# Patient Record
Sex: Male | Born: 1975 | Race: Black or African American | Hispanic: No | Marital: Single | Smoking: Current every day smoker
Health system: Southern US, Community
[De-identification: ages and names within clinical notes are randomized; demographics above are authoritative.]

## PROBLEM LIST (undated history)

## (undated) HISTORY — PX: OTHER SURGICAL HISTORY: SHX169

---

## 2005-11-23 ENCOUNTER — Emergency Department (HOSPITAL_COMMUNITY): Admission: EM | Admit: 2005-11-23 | Discharge: 2005-11-23 | Payer: Self-pay | Admitting: Emergency Medicine

## 2007-10-06 ENCOUNTER — Emergency Department (HOSPITAL_COMMUNITY): Admission: EM | Admit: 2007-10-06 | Discharge: 2007-10-06 | Payer: Self-pay | Admitting: Emergency Medicine

## 2010-10-02 ENCOUNTER — Emergency Department (HOSPITAL_COMMUNITY)
Admission: EM | Admit: 2010-10-02 | Discharge: 2010-10-02 | Payer: Self-pay | Source: Home / Self Care | Admitting: Emergency Medicine

## 2010-10-02 LAB — URINALYSIS, ROUTINE W REFLEX MICROSCOPIC
Bilirubin Urine: NEGATIVE
Nitrite: NEGATIVE
Specific Gravity, Urine: 1.023 (ref 1.005–1.030)
pH: 6.5 (ref 5.0–8.0)

## 2010-10-03 LAB — GC/CHLAMYDIA PROBE AMP, GENITAL: Chlamydia, DNA Probe: NEGATIVE

## 2010-10-03 LAB — URINE CULTURE

## 2011-05-28 LAB — URINALYSIS, ROUTINE W REFLEX MICROSCOPIC
Bilirubin Urine: NEGATIVE
Glucose, UA: NEGATIVE
Ketones, ur: NEGATIVE
Protein, ur: 100 — AB
pH: 5.5

## 2011-05-28 LAB — URINE MICROSCOPIC-ADD ON

## 2011-05-28 LAB — GC/CHLAMYDIA PROBE AMP, GENITAL: GC Probe Amp, Genital: NEGATIVE

## 2011-11-19 ENCOUNTER — Encounter (HOSPITAL_COMMUNITY): Payer: Self-pay | Admitting: Emergency Medicine

## 2011-11-19 ENCOUNTER — Emergency Department (HOSPITAL_COMMUNITY)
Admission: EM | Admit: 2011-11-19 | Discharge: 2011-11-19 | Disposition: A | Discharge status: Home / Self Care | Payer: Self-pay | Attending: Emergency Medicine | Admitting: Emergency Medicine

## 2011-11-19 DIAGNOSIS — R110 Nausea: Secondary | ICD-10-CM | POA: Insufficient documentation

## 2011-11-19 DIAGNOSIS — R109 Unspecified abdominal pain: Secondary | ICD-10-CM | POA: Insufficient documentation

## 2011-11-19 DIAGNOSIS — R197 Diarrhea, unspecified: Secondary | ICD-10-CM | POA: Insufficient documentation

## 2011-11-19 DIAGNOSIS — F17200 Nicotine dependence, unspecified, uncomplicated: Secondary | ICD-10-CM | POA: Insufficient documentation

## 2011-11-19 DIAGNOSIS — G43909 Migraine, unspecified, not intractable, without status migrainosus: Secondary | ICD-10-CM | POA: Insufficient documentation

## 2011-11-19 MED ORDER — DIPHENHYDRAMINE HCL 25 MG PO CAPS
50.0000 mg | ORAL_CAPSULE | Freq: Once | ORAL | Status: AC
  Administered 2011-11-19: 50 mg via ORAL
  Filled 2011-11-19: qty 2

## 2011-11-19 MED ORDER — KETOROLAC TROMETHAMINE 60 MG/2ML IM SOLN
60.0000 mg | Freq: Once | INTRAMUSCULAR | Status: AC
  Administered 2011-11-19: 60 mg via INTRAMUSCULAR
  Filled 2011-11-19: qty 2

## 2011-11-19 MED ORDER — METOCLOPRAMIDE HCL 5 MG/ML IJ SOLN
10.0000 mg | Freq: Once | INTRAMUSCULAR | Status: AC
  Administered 2011-11-19: 10 mg via INTRAMUSCULAR

## 2011-11-19 MED ORDER — KETOROLAC TROMETHAMINE 30 MG/ML IJ SOLN
30.0000 mg | Freq: Once | INTRAMUSCULAR | Status: DC
  Filled 2011-11-19: qty 1

## 2011-11-19 MED ORDER — METOCLOPRAMIDE HCL 5 MG/ML IJ SOLN
10.0000 mg | Freq: Once | INTRAMUSCULAR | Status: DC
  Filled 2011-11-19: qty 2

## 2011-11-19 MED ORDER — SODIUM CHLORIDE 0.9 % IV BOLUS (SEPSIS): 1000.0000 mL | Freq: Once | INTRAVENOUS | Status: DC

## 2011-11-19 MED ORDER — DIPHENHYDRAMINE HCL 50 MG/ML IJ SOLN
25.0000 mg | Freq: Once | INTRAMUSCULAR | Status: DC
  Filled 2011-11-19: qty 1

## 2011-11-19 NOTE — ED Provider Notes (Signed)
History     CSN: [id number]  Arrival date & time 11/19/11  1800   First MD Initiated Contact with Patient 11/19/11 1937      Chief Complaint  Patient presents with  . Abdominal Pain  . Headache    (Consider location/radiation/quality/duration/timing/severity/associated sxs/prior treatment) HPI Comments: Patient does emergency department with a chief complaint of migraine headache.  Patient states this headache is similar to his previous migraines however it is lasting longer than usual.  He reports the headache began on Tuesday and is associated with nausea and photophobia.  She denies fevers, night sweats, chills, change in vision, or disequilibrium.  In addition patient states that he developed abdominal cramping this afternoon with diarrhea.  Patient has a sick friend with a stomach gastritis that he spent time with yesterday. Pt has no other complaints at this time.   Patient is a 36 y.o. male presenting with headaches. The history is provided by the patient.  Headache  Associated symptoms include nausea. Pertinent negatives include no fever, no shortness of breath and no vomiting.    Past Medical History  Diagnosis Date  . Migraine     Past Surgical History  Procedure Date  . Surgery for blood clot removal from the right  buttock     No family history on file.  History  Substance Use Topics  . Smoking status: Current Some Day Smoker  . Smokeless tobacco: Not on file  . Alcohol Use: No      Review of Systems  Constitutional: Negative for fever, chills and appetite change.  HENT: Negative for ear pain, congestion, neck pain and neck stiffness.   Eyes: Negative for visual disturbance.  Respiratory: Negative for shortness of breath.   Cardiovascular: Negative for chest pain and leg swelling.  Gastrointestinal: Positive for nausea and diarrhea. Negative for vomiting, abdominal pain, constipation, blood in stool, abdominal distention, anal bleeding and rectal pain.    Genitourinary: Negative for dysuria, urgency and frequency.  Neurological: Positive for headaches. Negative for dizziness, syncope, weakness, light-headedness and numbness.  Psychiatric/Behavioral: Negative for confusion.  All other systems reviewed and are negative.    Allergies  Review of patient's allergies indicates no known allergies.  Home Medications   Current Outpatient Rx  Name Route Sig Dispense Refill  . DIPHENHYDRAMINE HCL (SLEEP) 25 MG PO TABS Oral Take 25-50 mg by mouth daily as needed. FOR ALLERGIES      BP 125/79  Pulse 62  Temp(Src) 98.8 F (37.1 C) (Oral)  Resp 16  Wt 170 lb 13.7 oz (77.5 kg)  SpO2 100%  Physical Exam  Nursing note and vitals reviewed. Constitutional: He is oriented to person, place, and time. He appears well-developed and well-nourished. No distress.  HENT:  Head: Normocephalic and atraumatic.  Right Ear: External ear normal.  Left Ear: External ear normal.  Eyes: Conjunctivae and EOM are normal. Pupils are equal, round, and reactive to light. Right eye exhibits no discharge. Left eye exhibits no discharge. Right conjunctiva is not injected. Right conjunctiva has no hemorrhage. Left conjunctiva is not injected. Left conjunctiva has no hemorrhage. No scleral icterus. Right eye exhibits no nystagmus. Left eye exhibits no nystagmus.  Neck: Normal range of motion and full passive range of motion without pain. Neck supple. No spinous process tenderness present. No rigidity.  Cardiovascular: Normal rate, regular rhythm, normal heart sounds and intact distal pulses.   Pulmonary/Chest: Effort normal and breath sounds normal. No respiratory distress.  Abdominal:  abd non ttp, bowel sounds normal  Musculoskeletal: Normal range of motion.  Neurological: He is alert and oriented to person, place, and time. He has normal strength. No cranial nerve deficit or sensory deficit. Coordination and gait normal.  Skin: Skin is warm and dry. No rash noted.  He is not diaphoretic.    ED Course  Procedures (including critical care time)  Labs Reviewed - No data to display No results found.   No diagnosis found.  Pt refused IV fluids and meds. Pt does not want lab work and states this is his typical migraine with a viral infection on top. Pt will be treated with IM meds and dc w advise to follow up with his PCP. Pt able to tolerate PO fluids in ED > 6oz   MDM  Migraine  Diarrhea   Patient is hemodynamically stable and in NAD prior to discharge. Diagnosis was discussed with patient who verbalizes understanding and is agreeable to discharge. Pt w no focal neuro deficits or localized abdominal pain. He has been advised to return to ED if any of the following s/s develop: -In, worsening severity of headache, your tractable vomiting, fever that persists greater than 101, nuchal rigidity, or localized abdominal pain.     Jaci Carrel, [place] 11/19/11 2056

## 2011-11-19 NOTE — ED Notes (Signed)
Pt presented to the Er with c/o abd pain, diarrhea, pt describes pain more as a discomfort, also reports migraine, 9/10, pt states he was in contact with people that were "sick with cold' and that Tuesday morning he started to feel weaker and develop HA, abd pain and diarrhea.

## 2011-11-19 NOTE — Discharge Instructions (Signed)
Followup with your primary care Dr. in regards to today's hospital visit.  If you do not have a Dr. to followup with use resource guide listed below to help he find one.  Also read instructions for reasons to return to the emergency department until her more diagnosis. Return to Ed if following symptoms develop: worsening severity of headache, your tractable vomiting, fever that persists greater than 101, nuchal rigidity, or localized abdominal pain.   RESOURCE GUIDE  Dental Problems  Patients with Medicaid: [facility] [phone]                                           [phone] [address] Phone:  [phone]                                                  Phone:  [phone]  If unable to pay or uninsured, contact:  [facility] or [facility]. to become qualified for the adult dental clinic.  Chronic Pain Problems Contact [facility]  [phone] Patients need to be referred by their primary care doctor.  Insufficient Money for Medicine Contact [employer]:  call "211" or [facility] [phone].  No Primary Care Doctor Call [facility]  [phone] Other agencies that provide inexpensive medical care    [facility]  [phone]    [facility]  [phone]    [facility]  [phone]    [facility]  [phone]    [facility]  [phone]    [facility]  [phone]  Psychological Services [facility]  [phone] [facility] Services  [phone] [facility]   [phone] (emergency services [phone])  Substance Abuse Resources [facility]  2016251869 [facility] [phone] [facility] [place] [phone] Floyd[facility] Flock [phone] Residential & Outpatient Substance Abuse Program  [phone]  Abuse/Neglect [facility] Child Abuse Hotline 410-075-2598 [facility]  Child Abuse Hotline 2090872075 (After Hours)  Emergency Shelter [facility] [phone]  Maternity Homes [facility] [place] [facility] [place] [phone] [facility] [phone]  MRSA Hotline #:   (321)495-9169    [facility] Resources  [facility]     [employer]                          [facility] Dept. [address] [place]                       [address]      [address]  Patrecia Pace  [facility] Phone:  [phone]                                   Phone:  [phone]                 Phone:  [place] Phone:  [place] [phone] (After Hours)

## 2011-11-20 NOTE — ED Provider Notes (Signed)
Medical screening examination/treatment/procedure(s) were performed by non-physician practitioner and as supervising physician I was immediately available for consultation/collaboration.   Hurman Horn, MD 11/20/11 2322

## 2012-04-20 ENCOUNTER — Emergency Department (HOSPITAL_COMMUNITY): Payer: Self-pay

## 2012-04-20 ENCOUNTER — Emergency Department (HOSPITAL_COMMUNITY)
Admission: EM | Admit: 2012-04-20 | Discharge: 2012-04-20 | Disposition: A | Discharge status: Home / Self Care | Payer: Self-pay | Attending: Emergency Medicine | Admitting: Emergency Medicine

## 2012-04-20 ENCOUNTER — Encounter (HOSPITAL_COMMUNITY): Payer: Self-pay

## 2012-04-20 ENCOUNTER — Emergency Department (INDEPENDENT_AMBULATORY_CARE_PROVIDER_SITE_OTHER)
Admission: EM | Admit: 2012-04-20 | Discharge: 2012-04-20 | Disposition: A | Discharge status: Other Acute Inpatient Hospital | Payer: Self-pay | Source: Home / Self Care | Attending: Emergency Medicine | Admitting: Emergency Medicine

## 2012-04-20 DIAGNOSIS — K4090 Unilateral inguinal hernia, without obstruction or gangrene, not specified as recurrent: Secondary | ICD-10-CM

## 2012-04-20 DIAGNOSIS — R197 Diarrhea, unspecified: Secondary | ICD-10-CM | POA: Insufficient documentation

## 2012-04-20 DIAGNOSIS — R300 Dysuria: Secondary | ICD-10-CM | POA: Insufficient documentation

## 2012-04-20 DIAGNOSIS — K4091 Unilateral inguinal hernia, without obstruction or gangrene, recurrent: Secondary | ICD-10-CM | POA: Insufficient documentation

## 2012-04-20 DIAGNOSIS — R198 Other specified symptoms and signs involving the digestive system and abdomen: Secondary | ICD-10-CM

## 2012-04-20 DIAGNOSIS — K529 Noninfective gastroenteritis and colitis, unspecified: Secondary | ICD-10-CM

## 2012-04-20 DIAGNOSIS — K5289 Other specified noninfective gastroenteritis and colitis: Secondary | ICD-10-CM | POA: Insufficient documentation

## 2012-04-20 DIAGNOSIS — R109 Unspecified abdominal pain: Secondary | ICD-10-CM | POA: Insufficient documentation

## 2012-04-20 DIAGNOSIS — F17200 Nicotine dependence, unspecified, uncomplicated: Secondary | ICD-10-CM | POA: Insufficient documentation

## 2012-04-20 LAB — URINALYSIS, ROUTINE W REFLEX MICROSCOPIC
Bilirubin Urine: NEGATIVE
Glucose, UA: NEGATIVE mg/dL
Hgb urine dipstick: NEGATIVE
Ketones, ur: NEGATIVE mg/dL
Leukocytes, UA: NEGATIVE
Nitrite: NEGATIVE
Protein, ur: NEGATIVE mg/dL
Specific Gravity, Urine: 1.025 (ref 1.005–1.030)
Urobilinogen, UA: 1 mg/dL (ref 0.0–1.0)
pH: 6 (ref 5.0–8.0)

## 2012-04-20 LAB — COMPREHENSIVE METABOLIC PANEL WITH GFR
Alkaline Phosphatase: 83 U/L (ref 39–117)
BUN: 17 mg/dL (ref 6–23)
CO2: 30 meq/L (ref 19–32)
Chloride: 104 meq/L (ref 96–112)
Creatinine, Ser: 1.18 mg/dL (ref 0.50–1.35)
GFR calc Af Amer: >90 mL/min (ref >90)
GFR calc non Af Amer: 78 mL/min — ABNORMAL LOW (ref >90)
Glucose, Bld: 87 mg/dL (ref 70–99)
Total Bilirubin: 0.2 mg/dL — ABNORMAL LOW (ref 0.3–1.2)

## 2012-04-20 LAB — POCT URINALYSIS DIP (DEVICE)
Bilirubin Urine: NEGATIVE
Glucose, UA: NEGATIVE mg/dL
Leukocytes, UA: NEGATIVE
Nitrite: NEGATIVE
Urobilinogen, UA: 0.2 mg/dL (ref 0.0–1.0)

## 2012-04-20 LAB — COMPREHENSIVE METABOLIC PANEL
ALT: 21 U/L (ref 0–53)
AST: 29 U/L (ref 0–37)
Albumin: 4.2 g/dL (ref 3.5–5.2)
Calcium: 9.4 mg/dL (ref 8.4–10.5)
Potassium: 4.1 mEq/L (ref 3.5–5.1)
Sodium: 140 mEq/L (ref 135–145)
Total Protein: 7.9 g/dL (ref 6.0–8.3)

## 2012-04-20 LAB — CBC WITH DIFFERENTIAL/PLATELET
Basophils Absolute: 0 10*3/uL (ref 0.0–0.1)
Basophils Relative: 1 % (ref 0–1)
Eosinophils Absolute: 0.3 10*3/uL (ref 0.0–0.7)
Eosinophils Relative: 4 % (ref 0–5)
HCT: 43.5 % (ref 39.0–52.0)
Hemoglobin: 14.7 g/dL (ref 13.0–17.0)
Lymphocytes Relative: 53 % — ABNORMAL HIGH (ref 12–46)
Lymphs Abs: 3.2 K/uL (ref 0.7–4.0)
MCH: 29.3 pg (ref 26.0–34.0)
MCHC: 33.8 g/dL (ref 30.0–36.0)
MCV: 86.7 fL (ref 78.0–100.0)
Monocytes Absolute: 0.5 K/uL (ref 0.1–1.0)
Monocytes Relative: 9 % (ref 3–12)
Neutro Abs: 2 K/uL (ref 1.7–7.7)
Neutrophils Relative %: 34 % — ABNORMAL LOW (ref 43–77)
Platelets: 170 10*3/uL (ref 150–400)
RBC: 5.02 MIL/uL (ref 4.22–5.81)
RDW: 13.6 % (ref 11.5–15.5)
WBC: 6 10*3/uL (ref 4.0–10.5)

## 2012-04-20 LAB — LIPASE, BLOOD: Lipase: 54 U/L (ref 11–59)

## 2012-04-20 MED ORDER — HYDROCODONE-ACETAMINOPHEN 5-500 MG PO TABS: 1.0000 | ORAL_TABLET | Freq: Four times a day (QID) | ORAL | Status: AC | PRN

## 2012-04-20 MED ORDER — IOHEXOL 300 MG/ML  SOLN
20.0000 mL | INTRAMUSCULAR | Status: AC
  Administered 2012-04-20 (×2): 20 mL via ORAL

## 2012-04-20 MED ORDER — METRONIDAZOLE 500 MG PO TABS: 500.0000 mg | ORAL_TABLET | Freq: Three times a day (TID) | ORAL | Status: AC

## 2012-04-20 MED ORDER — ONDANSETRON HCL 4 MG/2ML IJ SOLN
4.0000 mg | Freq: Once | INTRAMUSCULAR | Status: AC
  Administered 2012-04-20: 4 mg via INTRAVENOUS
  Filled 2012-04-20: qty 2

## 2012-04-20 MED ORDER — IOHEXOL 300 MG/ML  SOLN
100.0000 mL | Freq: Once | INTRAMUSCULAR | Status: AC | PRN
  Administered 2012-04-20: 100 mL via INTRAVENOUS

## 2012-04-20 MED ORDER — MAGNESIUM CITRATE PO SOLN: 296.0000 mL | Freq: Once | ORAL | Status: AC

## 2012-04-20 MED ORDER — FENTANYL CITRATE 0.05 MG/ML IJ SOLN
50.0000 ug | Freq: Once | INTRAMUSCULAR | Status: AC
  Administered 2012-04-20: 50 ug via INTRAVENOUS
  Filled 2012-04-20: qty 2

## 2012-04-20 MED ORDER — CIPROFLOXACIN HCL 500 MG PO TABS: 500.0000 mg | ORAL_TABLET | Freq: Two times a day (BID) | ORAL | Status: AC

## 2012-04-20 NOTE — ED Notes (Signed)
Report given to Lana, RN.

## 2012-04-20 NOTE — ED Notes (Signed)
Multiple issues; lower abdominal area pain and swelling for past 2 weeks, worse w meals; he has parted ways w girlfriend after he found out she had been unfaithful and had noted pain w urination and a sensation he had been stung by something; denies rectal or stool issues; NAD

## 2012-04-20 NOTE — ED Notes (Cosign Needed)
Derek Chan is a 36 y.o. male who presents with complaint of abdominal pain and cramping for several days. Also an episode of diarrhea. Pt pending CT abdomen for further evaluation.   Results for orders placed during the hospital encounter of 04/20/12  URINALYSIS, ROUTINE W REFLEX MICROSCOPIC      Component Value Range   Color, Urine YELLOW  YELLOW   APPearance HAZY (*) CLEAR   Specific Gravity, Urine 1.025  1.005 - 1.030   pH 6.0  5.0 - 8.0   Glucose, UA NEGATIVE  NEGATIVE mg/dL   Hgb urine dipstick NEGATIVE  NEGATIVE   Bilirubin Urine NEGATIVE  NEGATIVE   Ketones, ur NEGATIVE  NEGATIVE mg/dL   Protein, ur NEGATIVE  NEGATIVE mg/dL   Urobilinogen, UA 1.0  0.0 - 1.0 mg/dL   Nitrite NEGATIVE  NEGATIVE   Leukocytes, UA NEGATIVE  NEGATIVE  CBC WITH DIFFERENTIAL      Component Value Range   WBC 6.0  4.0 - 10.5 K/uL   RBC 5.02  4.22 - 5.81 MIL/uL   Hemoglobin 14.7  13.0 - 17.0 g/dL   HCT 11.9  14.7 - 82.9 %   MCV 86.7  78.0 - 100.0 fL   MCH 29.3  26.0 - 34.0 pg   MCHC 33.8  30.0 - 36.0 g/dL   RDW 56.2  13.0 - 86.5 %   Platelets 170  150 - 400 K/uL   Neutrophils Relative 34 (*) 43 - 77 %   Neutro Abs 2.0  1.7 - 7.7 K/uL   Lymphocytes Relative 53 (*) 12 - 46 %   Lymphs Abs 3.2  0.7 - 4.0 K/uL   Monocytes Relative 9  3 - 12 %   Monocytes Absolute 0.5  0.1 - 1.0 K/uL   Eosinophils Relative 4  0 - 5 %   Eosinophils Absolute 0.3  0.0 - 0.7 K/uL   Basophils Relative 1  0 - 1 %   Basophils Absolute 0.0  0.0 - 0.1 K/uL  COMPREHENSIVE METABOLIC PANEL      Component Value Range   Sodium 140  135 - 145 mEq/L   Potassium 4.1  3.5 - 5.1 mEq/L   Chloride 104  96 - 112 mEq/L   CO2 30  19 - 32 mEq/L   Glucose, Bld 87  70 - 99 mg/dL   BUN 17  6 - 23 mg/dL   Creatinine, Ser 7.84  0.50 - 1.35 mg/dL   Calcium 9.4  8.4 - 69.6 mg/dL   Total Protein 7.9  6.0 - 8.3 g/dL   Albumin 4.2  3.5 - 5.2 g/dL   AST 29  0 - 37 U/L   ALT 21  0 - 53 U/L   Alkaline Phosphatase 83  39 - 117 U/L   Total  Bilirubin 0.2 (*) 0.3 - 1.2 mg/dL   GFR calc non Af Amer 78 (*) >90 mL/min   GFR calc Af Amer >90  >90 mL/min  LIPASE, BLOOD      Component Value Range   Lipase 54  11 - 59 U/L   Ct Abdomen Pelvis W Contrast  04/20/2012  *RADIOLOGY REPORT*  Clinical Data: Mid to lower abdominal pain.  Nausea and fever for 2 weeks.  CT ABDOMEN AND PELVIS WITH CONTRAST  Technique:  Multidetector CT imaging of the abdomen and pelvis was performed following the standard protocol during bolus administration of intravenous contrast.  Contrast: OMNIPAQUE IOHEXOL 300 MG/ML  SOLN  Comparison: None.  Findings: The  exam is technically degraded by paucity of body fat. No intra-abdominal free air or free fluid.  The lung bases appear within normal limits.  Liver grossly appears normal.  Spleen normal.  Normal renal enhancement.  The adrenal glands appear normal.  Pancreas and common bile duct are normal.  No gross adenopathy.  Small bowel appears within normal limits.  Large stool burden is present in the ascending colon.  Normal appendix identified (image 56 series 2).  Several loops of nonopacified small bowel are present in the central abdomen.  There may be some mural thickening of unopacified loops in the central abdomen raising the possibility of enteritis although poorly evaluated.  Rectum appears within normal limits.  Urinary bladder distended.  Bones appear within normal limits.  There is a right inguinal hernia containing a loop of small bowel without convincing evidence of obstruction.  Recommend clinical correlation for reducibility.  IMPRESSION: 1.  Several mildly thickened nondistended loops of small bowel in the central abdomen.  This is nonspecific, and is most commonly associated with enteritis.  Infiltrative process such as lymphoma considered less likely.  Inflammatory bowel disease is in the differential considerations. 2. Prominent right-sided stool burden. 3.  Right inguinal hernia containing several loops of  small bowel without convincing evidence of obstruction.  Correlate with physical exam to assess for reducibility.  Original Report Authenticated By: Andreas Newport, M.D.    11:13 PM CT showing possible enteritis. Will start on antibiotics. Laxative for constipation. Inguinal hernia right. Will d/c home with follow up. Pt non toxic, no vomiting, afebrile, NAD.   Filed Vitals:   04/20/12 1722  BP: 124/76  Pulse: 71  Temp: 98.7 F (37.1 C)  Resp: [address] A Baylie Drakes, PA 04/20/12 2314

## 2012-04-20 NOTE — ED Notes (Signed)
Pt reports lower abd pain x3 weeks, pt reports tingling/burning w/urination, pt also reports burning w/intercourse this am, pt reports his fiance his experiencing the same symptoms. Pt reports his fiance' informed him she had been "unfaithful," [facility] for STD and a urine test. [facility] for dx and treatment.

## 2012-04-20 NOTE — ED Provider Notes (Signed)
History   This chart was scribed for Derek Chan. Oletta Lamas, MD by Charolett Bumpers . The patient was seen in room TR05C/TR05C. Patient's care was started at 1840.    CSN: [id number]  Arrival date & time 04/20/12  1716   First MD Initiated Contact with Patient 04/20/12 1840      Chief Complaint  Patient presents with  . SEXUALLY TRANSMITTED DISEASE    (Consider location/radiation/quality/duration/timing/severity/associated sxs/prior treatment) HPI Derek Chan is a 36 y.o. male who presents to the Emergency Department complaining of constant, moderate discomfort in lower abdominal area for the past 3 weeks. Pt reports associated dysuria and burning w/intercourse this am, but states that it has been some discomfort with sex prior but not as severe at today. Pt reports his fiance his experiencing the same symptoms. Pt reports his fiance' informed him she had been "unfaithful," Pt seen at Urgent Care today, Urgent Care swabbed pt's penis for STD and a urine test. Urgent Care sent pt to ED for dx and treatment. Pt reports some associated diarrhea a few days ago. Pt reports associated fever and chills as well. Pt reports that is lower abdominal pain is aggravated immediately after eating. Pt denies any h/o abdominal surgery.    Past Medical History  Diagnosis Date  . Migraine     Past Surgical History  Procedure Date  . Surgery for blood clot removal from the right  buttock     History reviewed. No pertinent family history.  History  Substance Use Topics  . Smoking status: Current Some Day Smoker  . Smokeless tobacco: Not on file  . Alcohol Use: No      Review of Systems  Constitutional: Positive for fever and chills.  Respiratory: Negative for shortness of breath.   Gastrointestinal: Positive for abdominal pain and diarrhea. Negative for nausea and vomiting.  Genitourinary: Positive for dysuria.  Neurological: Negative for weakness.  All other systems reviewed and are  negative.    Allergies  Review of patient's allergies indicates no known allergies.  Home Medications   Current Outpatient Rx  Name Route Sig Dispense Refill  . ACETAMINOPHEN 325 MG PO TABS Oral Take 650 mg by mouth every 6 (six) hours as needed. For pain    . AMOXICILLIN 500 MG PO CAPS Oral Take 500 mg by mouth 3 (three) times daily. For 10 days; Start date 03/28/12    . DIPHENHYDRAMINE HCL (SLEEP) 25 MG PO TABS Oral Take 25-50 mg by mouth daily as needed. For allergies    . HYDROCODONE-ACETAMINOPHEN 5-500 MG PO TABS Oral Take 1 tablet by mouth every 4 (four) hours as needed. For pain    . CIPROFLOXACIN HCL 500 MG PO TABS Oral Take 1 tablet (500 mg total) by mouth 2 (two) times daily. 14 tablet 0  . HYDROCODONE-ACETAMINOPHEN 5-500 MG PO TABS Oral Take 1-2 tablets by mouth every 6 (six) hours as needed for pain. 15 tablet 0  . MAGNESIUM CITRATE PO SOLN Oral Take 296 mLs by mouth once. 300 mL 0  . METRONIDAZOLE 500 MG PO TABS Oral Take 1 tablet (500 mg total) by mouth 3 (three) times daily. 21 tablet 0    BP 124/76  Pulse 71  Temp 98.7 F (37.1 C) (Oral)  Resp 16  SpO2 99%  Physical Exam  Nursing note and vitals reviewed. Constitutional: He is oriented to person, place, and time. He appears well-developed and well-nourished. No distress.  HENT:  Head: Normocephalic and atraumatic.  Eyes: EOM are  normal.  Neck: Neck supple. No tracheal deviation present.  Cardiovascular: Normal rate.   Pulmonary/Chest: Effort normal. No respiratory distress.  Abdominal: Soft. Bowel sounds are normal. There is tenderness. Hernia confirmed negative in the right inguinal area and confirmed negative in the left inguinal area.       Mild tenderness mostly in suprapubic, mildly in RLQ and LLQ.   Genitourinary: Penis normal. Right testis shows no tenderness. Left testis shows no tenderness. No discharge found.       Inguinal lymphadenopathy bilaterally that pt states he has previously been evaluated  for, and states that have reduced in size. No testicular pain, no other masses or hernia. No penile discharge.   Musculoskeletal: Normal range of motion.  Neurological: He is alert and oriented to person, place, and time.  Skin: Skin is warm and dry.  Psychiatric: He has a normal mood and affect. His behavior is normal.    ED Course  Procedures (including critical care time)  DIAGNOSTIC STUDIES: Oxygen Saturation is 99% on room air, normal by my interpretation.    COORDINATION OF CARE:  18:49-Discussed planned course of treatment with the patient, who is agreeable at this time.   Results for orders placed during the hospital encounter of 04/20/12  URINALYSIS, ROUTINE W REFLEX MICROSCOPIC      Component Value Range   Color, Urine YELLOW  YELLOW   APPearance HAZY (*) CLEAR   Specific Gravity, Urine 1.025  1.005 - 1.030   pH 6.0  5.0 - 8.0   Glucose, UA NEGATIVE  NEGATIVE mg/dL   Hgb urine dipstick NEGATIVE  NEGATIVE   Bilirubin Urine NEGATIVE  NEGATIVE   Ketones, ur NEGATIVE  NEGATIVE mg/dL   Protein, ur NEGATIVE  NEGATIVE mg/dL   Urobilinogen, UA 1.0  0.0 - 1.0 mg/dL   Nitrite NEGATIVE  NEGATIVE   Leukocytes, UA NEGATIVE  NEGATIVE  CBC WITH DIFFERENTIAL      Component Value Range   WBC 6.0  4.0 - 10.5 K/uL   RBC 5.02  4.22 - 5.81 MIL/uL   Hemoglobin 14.7  13.0 - 17.0 g/dL   HCT 62.9  52.8 - 41.3 %   MCV 86.7  78.0 - 100.0 fL   MCH 29.3  26.0 - 34.0 pg   MCHC 33.8  30.0 - 36.0 g/dL   RDW 24.4  01.0 - 27.2 %   Platelets 170  150 - 400 K/uL   Neutrophils Relative 34 (*) 43 - 77 %   Neutro Abs 2.0  1.7 - 7.7 K/uL   Lymphocytes Relative 53 (*) 12 - 46 %   Lymphs Abs 3.2  0.7 - 4.0 K/uL   Monocytes Relative 9  3 - 12 %   Monocytes Absolute 0.5  0.1 - 1.0 K/uL   Eosinophils Relative 4  0 - 5 %   Eosinophils Absolute 0.3  0.0 - 0.7 K/uL   Basophils Relative 1  0 - 1 %   Basophils Absolute 0.0  0.0 - 0.1 K/uL  COMPREHENSIVE METABOLIC PANEL      Component Value Range    Sodium 140  135 - 145 mEq/L   Potassium 4.1  3.5 - 5.1 mEq/L   Chloride 104  96 - 112 mEq/L   CO2 30  19 - 32 mEq/L   Glucose, Bld 87  70 - 99 mg/dL   BUN 17  6 - 23 mg/dL   Creatinine, Ser 5.36  0.50 - 1.35 mg/dL   Calcium 9.4  8.4 -  10.5 mg/dL   Total Protein 7.9  6.0 - 8.3 g/dL   Albumin 4.2  3.5 - 5.2 g/dL   AST 29  0 - 37 U/L   ALT 21  0 - 53 U/L   Alkaline Phosphatase 83  39 - 117 U/L   Total Bilirubin 0.2 (*) 0.3 - 1.2 mg/dL   GFR calc non Af Amer 78 (*) >90 mL/min   GFR calc Af Amer >90  >90 mL/min  LIPASE, BLOOD      Component Value Range   Lipase 54  11 - 59 U/L    No results found.   1. Enteritis   2. Inguinal hernia       MDM  I personally performed the services described in this documentation, which was scribed in my presence. The recorded information has been reviewed and considered.  Pt with concerns for STD.  Cultures sent.  However some suprapubic pains as well as inguinal lymphadenopathy which he reports has been present for a long time.  Seen at urgent care, sent for further eval.  Not toxic in appearing.  Mildly tender in lower abd.  No fevers, no vomiting, occasion nausea.  Will order CT of abd/pelvis, place in CDU holding and discussed with oncoming attending and PAC.       Derek Chan. Oletta Lamas, MD 04/30/12 [phone]

## 2012-04-20 NOTE — ED Notes (Signed)
Pt reports he had a ua completed at [facility]

## 2012-04-20 NOTE — ED Notes (Addendum)
Pt c/o abdominal pain and Pain with urination and intercourse. Pt states everything appears normal looking in genitalia. Pt states that pain is worse with palpation and also increases after eating. Denies n/v/d.

## 2012-04-20 NOTE — ED Provider Notes (Addendum)
History     CSN: [id number]  Arrival date & time 04/20/12  1425   First MD Initiated Contact with Patient 04/20/12 1557      Chief Complaint  Patient presents with  . Abdominal Pain    (Consider location/radiation/quality/duration/timing/severity/associated sxs/prior treatment) HPI Comments: Patient presents urgent care complaining of in term attempt sporadic tingling and burning with urination. Denies any discharge. Patient complains of lower abdominal pain swelling and pain for 2 weeks. Pain has gotten progressively worse in the last week. Denies any nausea or vomiting denies any diarrhea does rectal pain. He also describes that his girlfriend have admitted that she has had unprotected sex with another individual. Patient denies any fevers.  Patient is a 36 y.o. male presenting with abdominal pain.  Abdominal Pain The primary symptoms of the illness include abdominal pain and dysuria. The primary symptoms of the illness do not include fever, fatigue, hematochezia, vaginal discharge or vaginal bleeding. The current episode started more than 2 days ago. The problem has not changed since onset. The dysuria is not associated with frequency.  Symptoms associated with the illness do not include chills, anorexia, constipation, frequency or back pain.    Past Medical History  Diagnosis Date  . Migraine     Past Surgical History  Procedure Date  . Surgery for blood clot removal from the right  buttock     History reviewed. No pertinent family history.  History  Substance Use Topics  . Smoking status: Current Some Day Smoker  . Smokeless tobacco: Not on file  . Alcohol Use: No      Review of Systems  Constitutional: Negative for fever, chills, activity change and fatigue.  Gastrointestinal: Positive for abdominal pain. Negative for constipation, hematochezia, abdominal distention and anorexia.  Genitourinary: Positive for dysuria. Negative for frequency, vaginal bleeding and  vaginal discharge.  Musculoskeletal: Negative for back pain.  Skin: Negative for rash.    Allergies  Review of patient's allergies indicates no known allergies.  Home Medications   Current Outpatient Rx  Name Route Sig Dispense Refill  . DIPHENHYDRAMINE HCL (SLEEP) 25 MG PO TABS Oral Take 25-50 mg by mouth daily as needed. FOR ALLERGIES      BP 138/62  Pulse 68  Temp 98.7 F (37.1 C) (Oral)  Resp 18  SpO2 100%  Physical Exam  Nursing note and vitals reviewed. Constitutional: He appears well-developed and well-nourished.  HENT:  Head: Normocephalic.  Eyes: Conjunctivae are normal.  Pulmonary/Chest: Effort normal.  Abdominal: Soft. He exhibits no distension and no mass. There is no hepatosplenomegaly. There is tenderness in the periumbilical area and suprapubic area. There is no rebound, no guarding and no CVA tenderness.    Genitourinary: Testes normal and penis normal.  Musculoskeletal: Normal range of motion. He exhibits no tenderness.  Neurological: He is alert.  Skin: No rash noted. No erythema.    ED Course  Procedures (including critical care time)  Labs Reviewed  POCT URINALYSIS DIP (DEVICE) - Abnormal; Notable for the following:    Hgb urine dipstick MODERATE (*)     All other components within normal limits  GC/CHLAMYDIA PROBE AMP, GENITAL   No results found.   Moderate hematuria and reproducible suprapubic and infraumbilical pain.( focal)   MDM  Patient presents urgent care this evening complaining of an ongoing abdominal pain for 2 weeks. Examples of what concerning for infraumbilical reproducible pain somewhat disproportionate. Suspicion for a ureteral or vesical lithiasis. We have also obtained samples were GM and  Chlamydia DNA probe. Patient has been advised that he will be called if any abnormal testing. Patient denies any penile discharge and had a normal external genitalia exam. Most concerning is his abdominal exam.        Jimmie Molly,  MD 04/20/12 1702  Jimmie Molly, MD 04/20/12 [phone]

## 2012-04-21 LAB — GC/CHLAMYDIA PROBE AMP, GENITAL
Chlamydia, DNA Probe: NEGATIVE
GC Probe Amp, Genital: NEGATIVE

## 2015-03-27 ENCOUNTER — Emergency Department (HOSPITAL_BASED_OUTPATIENT_CLINIC_OR_DEPARTMENT_OTHER)
Admission: EM | Admit: 2015-03-27 | Discharge: 2015-03-27 | Disposition: A | Discharge status: Home / Self Care | Payer: Self-pay | Attending: Emergency Medicine | Admitting: Emergency Medicine

## 2015-03-27 ENCOUNTER — Encounter (HOSPITAL_BASED_OUTPATIENT_CLINIC_OR_DEPARTMENT_OTHER): Payer: Self-pay | Admitting: *Deleted

## 2015-03-27 DIAGNOSIS — Z8679 Personal history of other diseases of the circulatory system: Secondary | ICD-10-CM | POA: Insufficient documentation

## 2015-03-27 DIAGNOSIS — R51 Headache: Secondary | ICD-10-CM | POA: Insufficient documentation

## 2015-03-27 DIAGNOSIS — Z720 Tobacco use: Secondary | ICD-10-CM | POA: Insufficient documentation

## 2015-03-27 DIAGNOSIS — R5383 Other fatigue: Secondary | ICD-10-CM | POA: Insufficient documentation

## 2015-03-27 DIAGNOSIS — M545 Low back pain: Secondary | ICD-10-CM | POA: Insufficient documentation

## 2015-03-27 DIAGNOSIS — R42 Dizziness and giddiness: Secondary | ICD-10-CM | POA: Insufficient documentation

## 2015-03-27 LAB — BASIC METABOLIC PANEL
Anion gap: 4 — ABNORMAL LOW (ref 5–15)
BUN: 12 mg/dL (ref 6–20)
CO2: 26 mmol/L (ref 22–32)
Calcium: 8 mg/dL — ABNORMAL LOW (ref 8.9–10.3)
Chloride: 107 mmol/L (ref 101–111)
Creatinine, Ser: 1.01 mg/dL (ref 0.61–1.24)
GFR calc non Af Amer: >60 mL/min (ref >60)
GLUCOSE: 84 mg/dL (ref 65–99)
Potassium: 3.7 mmol/L (ref 3.5–5.1)
SODIUM: 137 mmol/L (ref 135–145)

## 2015-03-27 NOTE — ED Provider Notes (Signed)
CSN: [id number]     Arrival date & time 03/27/15  1908 History  This chart was scribed for Marily MemosJason Tarren Sabree, MD by Lyndel SafeKaitlyn Shelton, ED Scribe. This patient was seen in room MH11/MH11 and the patient's care was started 7:28 PM.   Chief Complaint  Patient presents with  . Fatigue   The history is provided by the patient. No language interpreter was used.   HPI Comments: Derek Chan is a 39 y.o. male, with a PMhx of migraines, who presents to the Emergency Department complaining of gradual onset, waxing and waning generalized fatigue that began 2 days ago with an associated headache. He states he works in [employer] and Monday, while at work, he began to feel dizzy and pre-syncopal. Pt reports he was drinking fluids as appropriate for working conditions. He notes a mild, 3/10 headache currently for which he has taken a goody powder that alleviated his headache mildly. He reports dark golden urine on Monday which resolved that day; stating his urine has been clear recently. The pt has missed work since the onset of his symptoms. He denies the current headache to be normal of his past migraine presentation. He additionally notes lower back pain which he attributes to his sleeping position last night. He denies LOC, abdominal pain, CP, SOB, nausea, vomiting, vision changes, BLE swelling, myalgias, arthralgias, hearing decrease or loss, or rashes.    Past Medical History  Diagnosis Date  . Migraine    Past Surgical History  Procedure Laterality Date  . Surgery for blood clot removal from the right  buttock     History reviewed. No pertinent family history. History  Substance Use Topics  . Smoking status: Current Some Day Smoker  . Smokeless tobacco: Not on file  . Alcohol Use: No    Review of Systems  Constitutional: Positive for fatigue. Negative for fever.  HENT: Negative for hearing loss.   Eyes: Negative for photophobia and visual disturbance.  Respiratory: Negative for shortness of  breath.   Cardiovascular: Negative for chest pain and leg swelling.  Gastrointestinal: Negative for nausea, vomiting, abdominal pain and diarrhea.  Musculoskeletal: Positive for back pain. Negative for myalgias and arthralgias.  Skin: Negative for rash.  Neurological: Positive for dizziness and headaches. Negative for syncope.    Allergies  Review of patient's allergies indicates no known allergies.  Home Medications   Prior to Admission medications   Medication Sig Start Date End Date Taking? Authorizing Provider  acetaminophen (TYLENOL) 325 MG tablet Take 650 mg by mouth every 6 (six) hours as needed. For pain   Yes Historical Provider, MD   BP 117/75 mmHg  Pulse 66  Temp(Src) 98.3 F (36.8 C) (Oral)  Resp 18  Ht 5\' 11"  (1.803 m)  Wt 172 lb (78.019 kg)  BMI 24.00 kg/m2  SpO2 99% Physical Exam  Constitutional: He is oriented to person, place, and time. He appears well-developed and well-nourished. No distress.  HENT:  Head: Normocephalic and atraumatic.  Mouth/Throat: Oropharynx is clear and moist.  Eyes: Conjunctivae are normal. Right eye exhibits no discharge. Left eye exhibits no discharge. No scleral icterus.  Neck: No JVD present.  Cardiovascular: Normal rate, regular rhythm and normal heart sounds.   Pulmonary/Chest: Effort normal and breath sounds normal. No respiratory distress.  Abdominal: Soft. Bowel sounds are normal. He exhibits no distension. There is no tenderness.  Neurological: He is alert and oriented to person, place, and time. He has normal reflexes. He displays normal reflexes. No cranial nerve deficit. Coordination  normal.  Strength and sensation intact bilaterally; normal, steady gait.   Skin: Skin is warm. No rash noted. No erythema. No pallor.  Psychiatric: He has a normal mood and affect. His behavior is normal.  Nursing note and vitals reviewed.  ED Course  Procedures  DIAGNOSTIC STUDIES: Oxygen Saturation is 99% on RA, normal by my  interpretation.    COORDINATION OF CARE: 7:36 PM Discussed treatment plan which includes to order diagnostic labs with pt. Pt is not driving himself home. Pt acknowledges and agrees to plan.   Labs Review Labs Reviewed  BASIC METABOLIC PANEL - Abnormal; Notable for the following:    Calcium 8.0 (*)    Anion gap 4 (*)    All other components within normal limits    Imaging Review No results found.   EKG Interpretation None      MDM   Final diagnoses:  Other fatigue   39 year old with likely heat related illness. BMP done to evaluate for hyponatremia as a large amount of water intake on Monday. Seems to have improved now and just wanted reevaluation to start back to work tomorrow. Family history otherwise as above. No rhabdomyolysis. Still making urine without significant kidney injury.  I have personally and contemperaneously reviewed labs and imaging and used in my decision making as above.   I feel the patient has had an appropriate workup for their chief complaint at this time and likelihood of emergent condition existing is low. They have been counseled on decision, discharge, follow up and which symptoms necessitate immediate return to the emergency department. They or their family verbally stated understanding and agreement with plan and discharged in stable condition.    I personally performed the services described in this documentation, which was scribed in my presence. The recorded information has been reviewed and is accurate.     Marily Memos, MD 03/27/15 2124

## 2015-03-27 NOTE — ED Notes (Signed)
MD at bedside. Discussing pt d/c

## 2015-03-27 NOTE — ED Notes (Signed)
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

## 2019-01-17 ENCOUNTER — Encounter (HOSPITAL_COMMUNITY): Payer: Self-pay

## 2019-01-17 ENCOUNTER — Emergency Department (HOSPITAL_COMMUNITY)
Admission: EM | Admit: 2019-01-17 | Discharge: 2019-01-17 | Disposition: A | Discharge status: Home / Self Care | Payer: Self-pay | Attending: Emergency Medicine | Admitting: Emergency Medicine

## 2019-01-17 ENCOUNTER — Emergency Department (HOSPITAL_COMMUNITY): Payer: Self-pay

## 2019-01-17 ENCOUNTER — Other Ambulatory Visit: Payer: Self-pay

## 2019-01-17 DIAGNOSIS — F17210 Nicotine dependence, cigarettes, uncomplicated: Secondary | ICD-10-CM | POA: Insufficient documentation

## 2019-01-17 DIAGNOSIS — Z79899 Other long term (current) drug therapy: Secondary | ICD-10-CM | POA: Insufficient documentation

## 2019-01-17 DIAGNOSIS — M5090 Cervical disc disorder, unspecified, unspecified cervical region: Secondary | ICD-10-CM | POA: Insufficient documentation

## 2019-01-17 LAB — CBC WITH DIFFERENTIAL/PLATELET
Abs Immature Granulocytes: 0.01 10*3/uL (ref 0.00–0.07)
Basophils Absolute: 0 10*3/uL (ref 0.0–0.1)
Basophils Relative: 1 %
Eosinophils Absolute: 0.2 10*3/uL (ref 0.0–0.5)
Eosinophils Relative: 4 %
HCT: 45.6 % (ref 39.0–52.0)
Hemoglobin: 14.7 g/dL (ref 13.0–17.0)
Immature Granulocytes: 0 %
Lymphocytes Relative: 49 %
Lymphs Abs: 2.6 10*3/uL (ref 0.7–4.0)
MCH: 29.2 pg (ref 26.0–34.0)
MCHC: 32.2 g/dL (ref 30.0–36.0)
MCV: 90.5 fL (ref 80.0–100.0)
Monocytes Absolute: 0.5 10*3/uL (ref 0.1–1.0)
Monocytes Relative: 9 %
Neutro Abs: 2 10*3/uL (ref 1.7–7.7)
Neutrophils Relative %: 37 %
Platelets: 203 10*3/uL (ref 150–400)
RBC: 5.04 MIL/uL (ref 4.22–5.81)
RDW: 14.7 % (ref 11.5–15.5)
WBC: 5.4 10*3/uL (ref 4.0–10.5)
nRBC: 0 % (ref 0.0–0.2)

## 2019-01-17 LAB — BASIC METABOLIC PANEL
Anion gap: 6 (ref 5–15)
BUN: 13 mg/dL (ref 6–20)
CO2: 26 mmol/L (ref 22–32)
Calcium: 9.2 mg/dL (ref 8.9–10.3)
Chloride: 106 mmol/L (ref 98–111)
Creatinine, Ser: 0.86 mg/dL (ref 0.61–1.24)
GFR calc Af Amer: >60 mL/min (ref >60)
GFR calc non Af Amer: >60 mL/min (ref >60)
Glucose, Bld: 111 mg/dL — ABNORMAL HIGH (ref 70–99)
Potassium: 4.2 mmol/L (ref 3.5–5.1)
Sodium: 138 mmol/L (ref 135–145)

## 2019-01-17 NOTE — ED Triage Notes (Signed)
Patient c/o numbness of the right thumb in March. patient states that he has been getting numbness of the right arm and shoulders . Patient states,"It seems like every week I have new numbness." No slurred speech. No other reported numbness.

## 2019-01-17 NOTE — ED Notes (Signed)
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

## 2019-01-17 NOTE — ED Provider Notes (Signed)
[facility]-EMERGENCY DEPT Provider Note   CSN: [id number] Arrival date & time: 01/17/19  1100    History   Chief Complaint Chief Complaint  Patient presents with   Numbness    HPI Derek Chan is a 43 y.o. male.     HPI Patient presents with almost 2 months of numbness especially to the right upper extremity.  States the numbness started in his right thumb and then progressed up his arm.  He also is developed numbness in his left arm as of late.  He denies head or neck injury.  Complains of some right-sided neck pain which he thinks is from the way that he slept.  He denies visual changes or speech changes.  No gait difficulty. Past Medical History:  Diagnosis Date   Migraine     There are no active problems to display for this patient.   Past Surgical History:  Procedure Laterality Date   surgery for blood clot removal from the right  buttock          Home Medications    Prior to Admission medications   Medication Sig Start Date End Date Taking? Authorizing Provider  acetaminophen (TYLENOL) 325 MG tablet Take 650 mg by mouth every 6 (six) hours as needed. For pain    [provider]    Family History Family History  Family history unknown: Yes    Social History Social History   Tobacco Use   Smoking status: Current Every Day Smoker    Packs/day: 0.50    Types: Cigarettes   Smokeless tobacco: Never Used  Substance Use Topics   Alcohol use: No   Drug use: Not Currently    Types: Marijuana     Allergies   Patient has no known allergies.   Review of Systems Review of Systems  Constitutional: Negative for chills and fever.  HENT: Negative for sore throat and trouble swallowing.   Eyes: Negative for visual disturbance.  Respiratory: Negative for cough and shortness of breath.   Cardiovascular: Negative for chest pain.  Gastrointestinal: Negative for abdominal pain, constipation, diarrhea, nausea and vomiting.    Musculoskeletal: Positive for myalgias and neck pain. Negative for arthralgias.  Skin: Negative for rash and wound.  Neurological: Positive for numbness. Negative for dizziness, speech difficulty, weakness, light-headedness and headaches.  All other systems reviewed and are negative.    Physical Exam Updated Vital Signs BP 116/82 (BP Location: Right Arm)    Pulse 66    Temp 98.8 F (37.1 C) (Oral)    Resp 14    Ht 5' 11.75" (1.822 m)    Wt 77.6 kg    SpO2 99%    BMI 23.35 kg/m   Physical Exam Vitals signs and nursing note reviewed.  Constitutional:      General: He is not in acute distress.    Appearance: Normal appearance. He is well-developed. He is not ill-appearing.  HENT:     Head: Normocephalic and atraumatic.     Comments: Cranial nerves II through XII intact.    Nose: Nose normal.     Mouth/Throat:     Mouth: Mucous membranes are moist.  Eyes:     Pupils: Pupils are equal, round, and reactive to light.  Neck:     Musculoskeletal: Normal range of motion and neck supple. Muscular tenderness present. No neck rigidity.     Comments: No posterior midline cervical tenderness to palpation.  Patient does have some right paracervical muscular tenderness.  No  meningismus. Cardiovascular:     Rate and Rhythm: Normal rate and regular rhythm.     Heart sounds: No murmur. No friction rub. No gallop.   Pulmonary:     Effort: Pulmonary effort is normal. No respiratory distress.     Breath sounds: Normal breath sounds. No stridor. No wheezing, rhonchi or rales.  Chest:     Chest wall: No tenderness.  Abdominal:     General: Bowel sounds are normal.     Palpations: Abdomen is soft.     Tenderness: There is no abdominal tenderness. There is no guarding or rebound.  Musculoskeletal: Normal range of motion.        General: No swelling, tenderness, deformity or signs of injury.     Right lower leg: No edema.     Left lower leg: No edema.     Comments: 2+ pulses in all extremities.   Positive Tinel sign in the right upper extremity.  No extremity swelling, erythema or warmth.  Lymphadenopathy:     Cervical: No cervical adenopathy.  Skin:    General: Skin is warm and dry.     Findings: No erythema or rash.  Neurological:     Mental Status: He is alert and oriented to person, place, and time.     Comments: Decreased sensation to light touch to the right hand and right forearm.  Question mild decreased grip strength in the right compared to the left.  5/5 motor bilateral lower extremities.  Ambulating without difficulty.  Psychiatric:        Mood and Affect: Mood normal.        Behavior: Behavior normal.      ED Treatments / Results  Labs (all labs ordered are listed, but only abnormal results are displayed) Labs Reviewed  BASIC METABOLIC PANEL - Abnormal; Notable for the following components:      Result Value   Glucose, Bld 111 (*)    All other components within normal limits  CBC WITH DIFFERENTIAL/PLATELET    EKG None  Radiology Mr Brain Wo Contrast  Result Date: 01/17/2019 CLINICAL DATA:  Numbness of the RIGHT thumb in March. Also numbness of the RIGHT arm and shoulders. This is described as progressive. EXAM: MRI HEAD WITHOUT CONTRAST MRI CERVICAL SPINE WITHOUT CONTRAST TECHNIQUE: Multiplanar, multiecho pulse sequences of the brain and surrounding structures, and cervical spine, to include the craniocervical junction and cervicothoracic junction, were obtained without intravenous contrast. COMPARISON:  None. FINDINGS: MRI HEAD FINDINGS Brain: No evidence for acute infarction, hemorrhage, mass lesion, hydrocephalus, or extra-axial fluid. Normal cerebral volume. No white matter disease. Vascular: Flow voids are maintained throughout the carotid, basilar, and vertebral arteries. There are no areas of chronic hemorrhage. Skull and upper cervical spine: Normal marrow signal. Sinuses/Orbits: No significant sinus opacity.  Negative orbits. Other: None. MRI CERVICAL  SPINE FINDINGS Alignment: Physiologic. Vertebrae: Low signal intensity bone marrow on T1 weighted images could be related to smoking, anemia, or body habitus. No evidence of diskitis, or worrisome osseous lesion. Cord: Cord flattening at C3-4, with slight abnormal cord signal, due to disc pathology. Posterior Fossa, vertebral arteries, paraspinal tissues: Unremarkable. Disc levels: C2-3:  Unremarkable. C3-4: Central and rightward disc extrusion. Significant cord flattening. Slight abnormal cord signal best seen on sagittal imaging, see image 7 of T2 and STIR sagittal series. RIGHT greater than LEFT C4 foraminal narrowing. Canal diameter estimated at 5-6 mm. C4-5:  Unremarkable. C5-6: Annular bulge with osseous spurring. Effacement anterior subarachnoid space without significant cord compression. LEFT  C6 foraminal narrowing. C6-7: Disc space narrowing. Central and leftward protrusion. Effacement anterior subarachnoid space with mild cord flattening. LEFT greater than RIGHT C7 foraminal narrowing. C7-T1:  Unremarkable. IMPRESSION: MRI brain is negative. MRI cervical spine demonstrates a dominant abnormality at C3-4, where a central and rightward disc extrusion results in significant cord flattening and slight abnormal cord signal. RIGHT greater than LEFT C4 foraminal narrowing. Minor cervical spondylosis elsewhere. Electronically Signed   By: Elsie StainJohn T Curnes M.D.   On: 01/17/2019 13:22   Mr Cervical Spine Wo Contrast  Result Date: 01/17/2019 CLINICAL DATA:  Numbness of the RIGHT thumb in March. Also numbness of the RIGHT arm and shoulders. This is described as progressive. EXAM: MRI HEAD WITHOUT CONTRAST MRI CERVICAL SPINE WITHOUT CONTRAST TECHNIQUE: Multiplanar, multiecho pulse sequences of the brain and surrounding structures, and cervical spine, to include the craniocervical junction and cervicothoracic junction, were obtained without intravenous contrast. COMPARISON:  None. FINDINGS: MRI HEAD FINDINGS Brain: No  evidence for acute infarction, hemorrhage, mass lesion, hydrocephalus, or extra-axial fluid. Normal cerebral volume. No white matter disease. Vascular: Flow voids are maintained throughout the carotid, basilar, and vertebral arteries. There are no areas of chronic hemorrhage. Skull and upper cervical spine: Normal marrow signal. Sinuses/Orbits: No significant sinus opacity.  Negative orbits. Other: None. MRI CERVICAL SPINE FINDINGS Alignment: Physiologic. Vertebrae: Low signal intensity bone marrow on T1 weighted images could be related to smoking, anemia, or body habitus. No evidence of diskitis, or worrisome osseous lesion. Cord: Cord flattening at C3-4, with slight abnormal cord signal, due to disc pathology. Posterior Fossa, vertebral arteries, paraspinal tissues: Unremarkable. Disc levels: C2-3:  Unremarkable. C3-4: Central and rightward disc extrusion. Significant cord flattening. Slight abnormal cord signal best seen on sagittal imaging, see image 7 of T2 and STIR sagittal series. RIGHT greater than LEFT C4 foraminal narrowing. Canal diameter estimated at 5-6 mm. C4-5:  Unremarkable. C5-6: Annular bulge with osseous spurring. Effacement anterior subarachnoid space without significant cord compression. LEFT C6 foraminal narrowing. C6-7: Disc space narrowing. Central and leftward protrusion. Effacement anterior subarachnoid space with mild cord flattening. LEFT greater than RIGHT C7 foraminal narrowing. C7-T1:  Unremarkable. IMPRESSION: MRI brain is negative. MRI cervical spine demonstrates a dominant abnormality at C3-4, where a central and rightward disc extrusion results in significant cord flattening and slight abnormal cord signal. RIGHT greater than LEFT C4 foraminal narrowing. Minor cervical spondylosis elsewhere. Electronically Signed   By: Elsie StainJohn T Curnes M.D.   On: 01/17/2019 13:22    Procedures Procedures (including critical care time)  Medications Ordered in ED Medications - No data to  display   Initial Impression / Assessment and Plan / ED Course  I have reviewed the triage vital signs and the nursing notes.  Pertinent labs & imaging results that were available during my care of the patient were reviewed by me and considered in my medical decision making (see chart for details).        Patient with severe disc disease which appears to be responsible for his symptoms.  Advised close follow-up with neurosurgery.  Return precautions given.   Final Clinical Impressions(s) / ED Diagnoses   Final diagnoses:  Cervical disc disease    ED Discharge Orders    None       Loren RacerYelverton, Elize Pinon, MD 01/17/19 [phone]

## 2019-02-23 ENCOUNTER — Emergency Department (HOSPITAL_COMMUNITY): Payer: Self-pay

## 2019-02-23 ENCOUNTER — Emergency Department (HOSPITAL_COMMUNITY)
Admission: EM | Admit: 2019-02-23 | Discharge: 2019-02-23 | Disposition: A | Discharge status: Home / Self Care | Payer: Self-pay | Attending: Emergency Medicine | Admitting: Emergency Medicine

## 2019-02-23 ENCOUNTER — Other Ambulatory Visit: Payer: Self-pay

## 2019-02-23 ENCOUNTER — Encounter (HOSPITAL_COMMUNITY): Payer: Self-pay | Admitting: Emergency Medicine

## 2019-02-23 DIAGNOSIS — Z79899 Other long term (current) drug therapy: Secondary | ICD-10-CM | POA: Insufficient documentation

## 2019-02-23 DIAGNOSIS — F17210 Nicotine dependence, cigarettes, uncomplicated: Secondary | ICD-10-CM | POA: Insufficient documentation

## 2019-02-23 DIAGNOSIS — N451 Epididymitis: Secondary | ICD-10-CM | POA: Insufficient documentation

## 2019-02-23 DIAGNOSIS — R319 Hematuria, unspecified: Secondary | ICD-10-CM | POA: Insufficient documentation

## 2019-02-23 LAB — COMPREHENSIVE METABOLIC PANEL
ALT: 18 U/L (ref 0–44)
AST: 18 U/L (ref 15–41)
Albumin: 3.3 g/dL — ABNORMAL LOW (ref 3.5–5.0)
Alkaline Phosphatase: 69 U/L (ref 38–126)
Anion gap: 10 (ref 5–15)
BUN: 11 mg/dL (ref 6–20)
CO2: 27 mmol/L (ref 22–32)
Calcium: 9.2 mg/dL (ref 8.9–10.3)
Chloride: 104 mmol/L (ref 98–111)
Creatinine, Ser: 0.98 mg/dL (ref 0.61–1.24)
GFR calc Af Amer: >60 mL/min (ref >60)
GFR calc non Af Amer: >60 mL/min (ref >60)
Glucose, Bld: 114 mg/dL — ABNORMAL HIGH (ref 70–99)
Potassium: 4.4 mmol/L (ref 3.5–5.1)
Sodium: 141 mmol/L (ref 135–145)
Total Bilirubin: 0.5 mg/dL (ref 0.3–1.2)
Total Protein: 7.7 g/dL (ref 6.5–8.1)

## 2019-02-23 LAB — URINALYSIS, ROUTINE W REFLEX MICROSCOPIC
Bilirubin Urine: NEGATIVE
Glucose, UA: NEGATIVE mg/dL
Ketones, ur: NEGATIVE mg/dL
Nitrite: NEGATIVE
Protein, ur: >=300 mg/dL — AB
RBC / HPF: >50 RBC/hpf — ABNORMAL HIGH (ref 0–5)
Specific Gravity, Urine: 1.018 (ref 1.005–1.030)
WBC, UA: >50 WBC/hpf — ABNORMAL HIGH (ref 0–5)
pH: 6 (ref 5.0–8.0)

## 2019-02-23 LAB — CBC WITH DIFFERENTIAL/PLATELET
Abs Immature Granulocytes: 0.05 10*3/uL (ref 0.00–0.07)
Basophils Absolute: 0.1 10*3/uL (ref 0.0–0.1)
Basophils Relative: 1 %
Eosinophils Absolute: 0.3 10*3/uL (ref 0.0–0.5)
Eosinophils Relative: 2 %
HCT: 42.2 % (ref 39.0–52.0)
Hemoglobin: 13.9 g/dL (ref 13.0–17.0)
Immature Granulocytes: 1 %
Lymphocytes Relative: 24 %
Lymphs Abs: 2.6 10*3/uL (ref 0.7–4.0)
MCH: 29 pg (ref 26.0–34.0)
MCHC: 32.9 g/dL (ref 30.0–36.0)
MCV: 87.9 fL (ref 80.0–100.0)
Monocytes Absolute: 1 10*3/uL (ref 0.1–1.0)
Monocytes Relative: 9 %
Neutro Abs: 6.9 10*3/uL (ref 1.7–7.7)
Neutrophils Relative %: 63 %
Platelets: 294 10*3/uL (ref 150–400)
RBC: 4.8 MIL/uL (ref 4.22–5.81)
RDW: 14.6 % (ref 11.5–15.5)
WBC: 10.9 10*3/uL — ABNORMAL HIGH (ref 4.0–10.5)
nRBC: 0 % (ref 0.0–0.2)

## 2019-02-23 MED ORDER — DOXYCYCLINE HYCLATE 100 MG PO CAPS: 100.0000 mg | ORAL_CAPSULE | Freq: Two times a day (BID) | ORAL | 0 refills | Status: DC

## 2019-02-23 MED ORDER — HYDROCODONE-ACETAMINOPHEN 5-325 MG PO TABS: 1.0000 | ORAL_TABLET | Freq: Four times a day (QID) | ORAL | 0 refills | Status: DC | PRN

## 2019-02-23 MED ORDER — IBUPROFEN 600 MG PO TABS: 600.0000 mg | ORAL_TABLET | Freq: Four times a day (QID) | ORAL | 0 refills | Status: DC | PRN

## 2019-02-23 MED ORDER — KETOROLAC TROMETHAMINE 15 MG/ML IJ SOLN: 15.0000 mg | Freq: Once | INTRAMUSCULAR | Status: DC

## 2019-02-23 MED ORDER — KETOROLAC TROMETHAMINE 30 MG/ML IJ SOLN
30.0000 mg | Freq: Once | INTRAMUSCULAR | Status: AC
  Administered 2019-02-23: 30 mg via INTRAVENOUS
  Filled 2019-02-23: qty 1

## 2019-02-23 MED ORDER — DOXYCYCLINE HYCLATE 100 MG PO TABS
100.0000 mg | ORAL_TABLET | Freq: Once | ORAL | Status: AC
  Administered 2019-02-23: 100 mg via ORAL
  Filled 2019-02-23: qty 1

## 2019-02-23 MED ORDER — CEFTRIAXONE SODIUM 250 MG IJ SOLR
250.0000 mg | Freq: Once | INTRAMUSCULAR | Status: AC
  Administered 2019-02-23: 250 mg via INTRAMUSCULAR
  Filled 2019-02-23: qty 250

## 2019-02-23 NOTE — ED Triage Notes (Signed)
Patient arrived by self from home. Pt c/o groin pain and blood in urine. Patient stated complaints started Monday.  Patient stated he had a fever at home.   Patient stated on Monday patient had a little discomfort. Patient stated Tuesday night it became for painful that he couldn't move his legs.   Patient stated this had never happened before.   Patient took at home Tylenol and Penicillin w/ some relief.

## 2019-02-23 NOTE — ED Provider Notes (Signed)
[facility] DEPT Provider Note   CSN: [id number] Arrival date & time: 02/23/19  0746    History   Chief Complaint Chief Complaint  Patient presents with  . Groin Pain  . Hematuria    HPI Derek Chan is a 43 y.o. male with history of migraines who presents with a 4-day history of groin pain and swelling as well as hematuria.  Patient reports having severe tenderness and pain to his left testicle and scrotum.  He has had associated swelling.  He has had some left lower quadrant pain with this.  He describes the pain is constant.  He has felt like he has had a fever, the highest measured was 99.3.  He has no history of kidney stones.  He denies any flank pain.  He denies any other urinary symptoms sides hematuria, but he has seen some but he describes his blood clots.  He has been taking Tylenol for pain and fever at home.  He denies any STD exposure, states he is only sexually active with his wife.     HPI  Past Medical History:  Diagnosis Date  . Migraine     There are no active problems to display for this patient.   Past Surgical History:  Procedure Laterality Date  . surgery for blood clot removal from the right  buttock          Home Medications    Prior to Admission medications   Medication Sig Start Date End Date Taking? Authorizing Provider  acetaminophen (TYLENOL) 325 MG tablet Take 650 mg by mouth every 6 (six) hours as needed. For pain    [provider]  doxycycline (VIBRAMYCIN) 100 MG capsule Take 1 capsule (100 mg total) by mouth 2 (two) times daily. 02/23/19   Janelli Welling, Bea Graff, PA-C  HYDROcodone-acetaminophen (NORCO/VICODIN) 5-325 MG tablet Take 1 tablet by mouth every 6 (six) hours as needed for severe pain. 02/23/19   Jalynne Persico, Bea Graff, PA-C  ibuprofen (ADVIL) 600 MG tablet Take 1 tablet (600 mg total) by mouth every 6 (six) hours as needed. 02/23/19   Frederica Kuster, PA-C    Family History Family History  Family  history unknown: Yes    Social History Social History   Tobacco Use  . Smoking status: Current Every Day Smoker    Packs/day: 0.50    Types: Cigarettes  . Smokeless tobacco: Never Used  Substance Use Topics  . Alcohol use: No  . Drug use: Not Currently    Types: Marijuana     Allergies   Patient has no known allergies.   Review of Systems Review of Systems  Constitutional: Negative for chills and fever.  HENT: Negative for facial swelling and sore throat.   Respiratory: Negative for shortness of breath.   Cardiovascular: Negative for chest pain.  Gastrointestinal: Positive for abdominal pain (LLQ). Negative for nausea and vomiting.  Genitourinary: Positive for hematuria, scrotal swelling and testicular pain. Negative for discharge, dysuria, flank pain, penile pain and penile swelling.  Musculoskeletal: Negative for back pain.  Skin: Negative for rash and wound.  Neurological: Negative for headaches.  Psychiatric/Behavioral: The patient is not nervous/anxious.      Physical Exam Updated Vital Signs BP 118/71 (BP Location: Right Arm)   Pulse 78   Temp 99.8 F (37.7 C) (Oral)   Resp 15   Ht 5' 11.75" (1.822 m)   Wt 77.1 kg   SpO2 99%   BMI 23.22 kg/m   Physical Exam  Vitals signs and nursing note reviewed. Exam conducted with a chaperone present.  Constitutional:      General: He is not in acute distress.    Appearance: He is well-developed. He is not diaphoretic.  HENT:     Head: Normocephalic and atraumatic.     Mouth/Throat:     Pharynx: No oropharyngeal exudate.  Eyes:     General: No scleral icterus.       Right eye: No discharge.        Left eye: No discharge.     Conjunctiva/sclera: Conjunctivae normal.     Pupils: Pupils are equal, round, and reactive to light.  Neck:     Musculoskeletal: Normal range of motion and neck supple.     Thyroid: No thyromegaly.  Cardiovascular:     Rate and Rhythm: Normal rate and regular rhythm.     Heart sounds:  Normal heart sounds. No murmur. No friction rub. No gallop.   Pulmonary:     Effort: Pulmonary effort is normal. No respiratory distress.     Breath sounds: Normal breath sounds. No stridor. No wheezing or rales.  Abdominal:     General: Bowel sounds are normal. There is no distension.     Palpations: Abdomen is soft.     Tenderness: There is abdominal tenderness in the left lower quadrant. There is no right CVA tenderness, left CVA tenderness, guarding or rebound.    Genitourinary:    Penis: Normal and circumcised.      Scrotum/Testes:        Right: Tenderness or swelling not present.        Left: Tenderness and swelling present.     Epididymis:     Right: No tenderness.     Left: Tenderness present.     Prostate: Normal. Not enlarged and not tender.  Lymphadenopathy:     Cervical: No cervical adenopathy.  Skin:    General: Skin is warm and dry.     Coloration: Skin is not pale.     Findings: No rash.  Neurological:     Mental Status: He is alert.     Coordination: Coordination normal.      ED Treatments / Results  Labs (all labs ordered are listed, but only abnormal results are displayed) Labs Reviewed  CBC WITH DIFFERENTIAL/PLATELET - Abnormal; Notable for the following components:      Result Value   WBC 10.9 (*)    All other components within normal limits  COMPREHENSIVE METABOLIC PANEL - Abnormal; Notable for the following components:   Glucose, Bld 114 (*)    Albumin 3.3 (*)    All other components within normal limits  URINALYSIS, ROUTINE W REFLEX MICROSCOPIC - Abnormal; Notable for the following components:   APPearance HAZY (*)    Hgb urine dipstick MODERATE (*)    Protein, ur >=300 (*)    Leukocytes,Ua TRACE (*)    RBC / HPF >50 (*)    WBC, UA >50 (*)    Bacteria, UA RARE (*)    All other components within normal limits  URINE CULTURE  GC/CHLAMYDIA PROBE AMP ([place]) NOT AT [facility]    EKG None  Radiology [place] Scrotum W/doppler  Result Date:  02/23/2019 CLINICAL DATA:  Left-sided scrotal pain. Scrotal swelling. Hematuria. EXAM: SCROTAL ULTRASOUND DOPPLER ULTRASOUND OF THE TESTICLES TECHNIQUE: Complete ultrasound examination of the testicles, epididymis, and other scrotal structures was performed. Color and spectral Doppler ultrasound were also utilized to evaluate blood flow to the testicles.  COMPARISON:  None. FINDINGS: Right testicle Measurements: 4.7 x 2.4 x 3.2 cm. No mass or microlithiasis visualized. Left testicle Measurements: 4.2 x 2.5 x 2.9 cm. No mass or microlithiasis visualized. Right epididymis: Normal in size and echogenicity. 6 mm simple appearing cyst. Normal vascularity. Left epididymis: Increased in size when compared to the right as well as heterogeneous and demonstrating increased vascularity. 3 mm cyst. Hydrocele: Bilateral hydroceles small on the right and moderate to large on the left. Left with a septation. Varicocele:  Bilateral varicoceles. Pulsed Doppler interrogation of both testes demonstrates normal low resistance arterial and venous waveforms bilaterally. IMPRESSION: 1. Acute left epididymitis. 2. No other acute abnormalities. No testicular masses or evidence of torsion. 3. Moderate to large left and small right hydroceles. Small bilateral epididymal cysts. 4. Bilateral varicoceles. Electronically Signed   By: Amie Portlandavid  Ormond M.D.   On: 02/23/2019 10:46    Procedures Procedures (including critical care time)  Medications Ordered in ED Medications  cefTRIAXone (ROCEPHIN) injection 250 mg (250 mg Intramuscular Given 02/23/19 1122)  doxycycline (VIBRA-TABS) tablet 100 mg (100 mg Oral Given 02/23/19 1119)  ketorolac (TORADOL) 30 MG/ML injection 30 mg (30 mg Intravenous Given 02/23/19 1120)     Initial Impression / Assessment and Plan / ED Course  I have reviewed the triage vital signs and the nursing notes.  Pertinent labs & imaging results that were available during my care of the patient were reviewed by me and  considered in my medical decision making (see chart for details).        Patient with left scrotal swelling and pain found to be acute left epididymitis.  Patient also found to have bilateral hydroceles, epididymal cysts, and varicoceles no concern for prostatitis, there is no tenderness or bogginess.  Patient has no concern for STD exposure.  UA shows greater than 50 RBCs and WBCs.  Urine culture, as well as GC/chlamydia sent.  Patient treated with Rocephin and doxycycline.  Scrotal support and ice recommended.  Ibuprofen as well as Norco sent home for pain.  Patient referred to urology for follow-up and further management.  He also requests information for the neurosurgeon he was recommended to see last visit for cervical disc extrusion.  He is advised to call both of these offices to make an appointment.  Return precautions discussed.  Patient understands and agrees with plan.  Patient vitals stable throughout ED course and discharged in satisfactory condition.  Final Clinical Impressions(s) / ED Diagnoses   Final diagnoses:  Epididymitis    ED Discharge Orders         Ordered    ibuprofen (ADVIL) 600 MG tablet  Every 6 hours PRN     02/23/19 1121    HYDROcodone-acetaminophen (NORCO/VICODIN) 5-325 MG tablet  Every 6 hours PRN     02/23/19 1121    doxycycline (VIBRAMYCIN) 100 MG capsule  2 times daily     02/23/19 1121           Rollins Wrightson, PoundAlexandra M, PA-C 02/23/19 1654    Samuel JesterMcManus, Kathleen, DO 02/27/19 1443

## 2019-02-23 NOTE — Discharge Instructions (Signed)
Take doxycycline until completed.  Take ibuprofen as prescribed every 6 hours.  For severe, breakthrough pain, take Norco every 6 hours.  I recommend using ice and scrotal support like a jockstrap.  Please follow-up with urologist below for further evaluation and recheck of your epididymitis.  Please return to the emergency department if you develop any new or worsening symptoms.  Do not drink alcohol, drive, operate machinery or participate in any other potentially dangerous activities while taking opiate pain medication as it may make you sleepy. Do not take this medication with any other sedating medications, either prescription or over-the-counter. If you were prescribed Percocet or Vicodin, do not take these with acetaminophen (Tylenol) as it is already contained within these medications and overdose of Tylenol is dangerous.   This medication is an opiate (or narcotic) pain medication and can be habit forming.  Use it as little as possible to achieve adequate pain control.  Do not use or use it with extreme caution if you have a history of opiate abuse or dependence. This medication is intended for your use only - do not give any to anyone else and keep it in a secure place where nobody else, especially children, have access to it. It will also cause or worsen constipation, so you may want to consider taking an over-the-counter stool softener while you are taking this medication.

## 2019-02-24 LAB — URINE CULTURE: Culture: <10000 — AB

## 2020-01-05 IMAGING — US ULTRASOUND SCROTUM DOPPLER COMPLETE
1 series · 13 of 25 positions shown · non-contrast
Comparison: None.

CLINICAL DATA: Left-sided scrotal pain. Scrotal swelling.
Hematuria.

EXAM:
SCROTAL ULTRASOUND
DOPPLER ULTRASOUND OF THE TESTICLES
TECHNIQUE: Complete ultrasound examination of the testicles, epididymis, and
other scrotal structures was performed. Color and spectral Doppler
ultrasound were also utilized to evaluate blood flow to the
testicles.

[Series 1: ultrasound scrotum doppler complete · 13 of 162 slices shown]
[im 1/162]
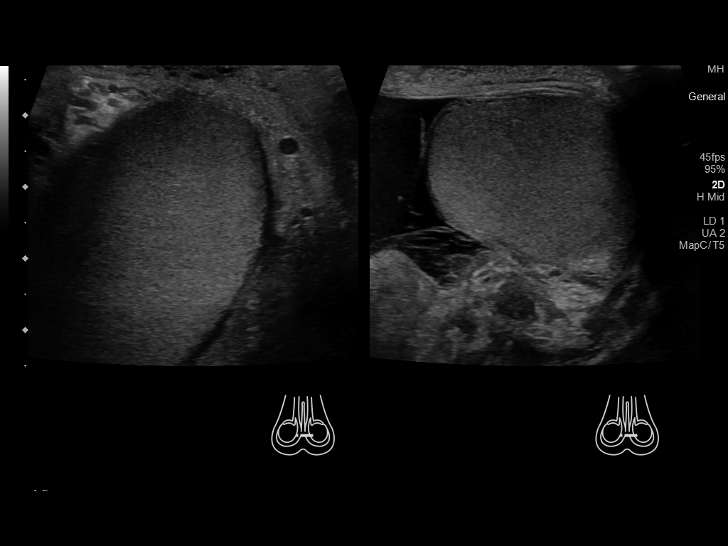
[im 14/162]
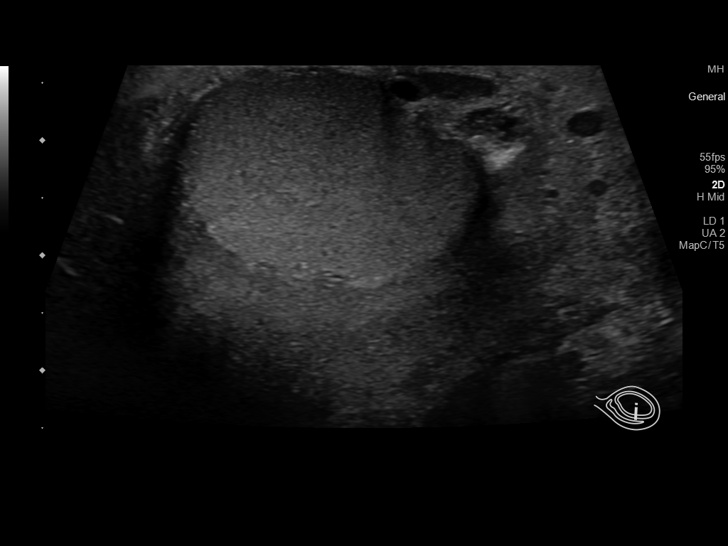
[im 27/162]
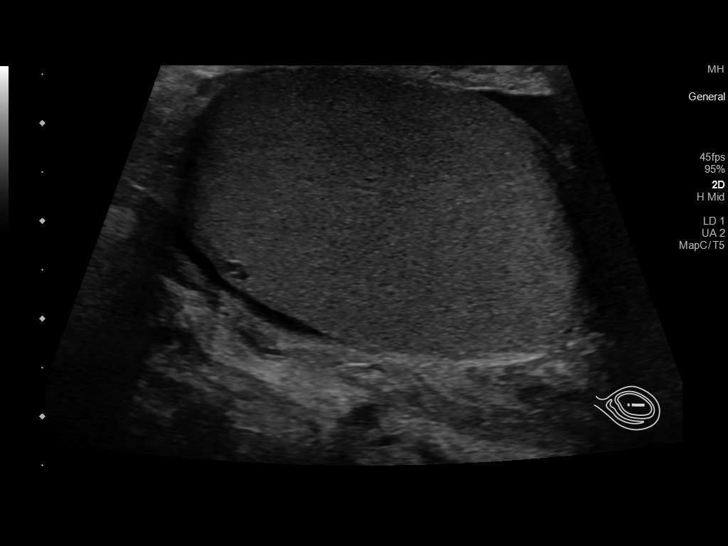
[im 41/162]
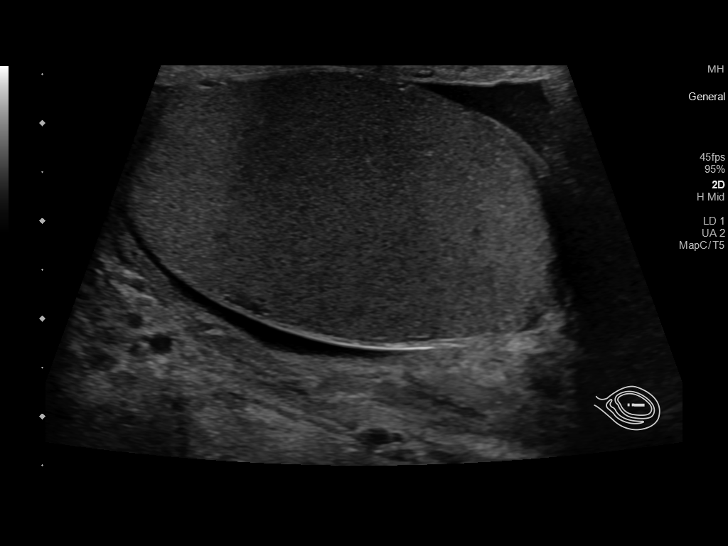
[im 54/162]
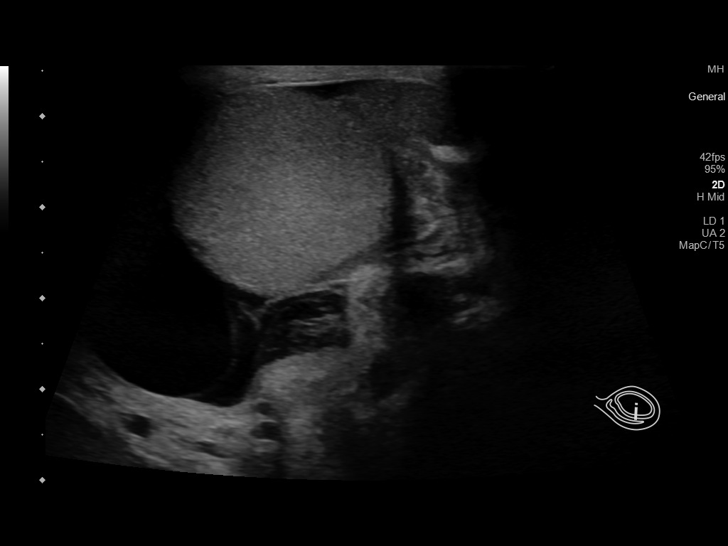
[im 68/162]
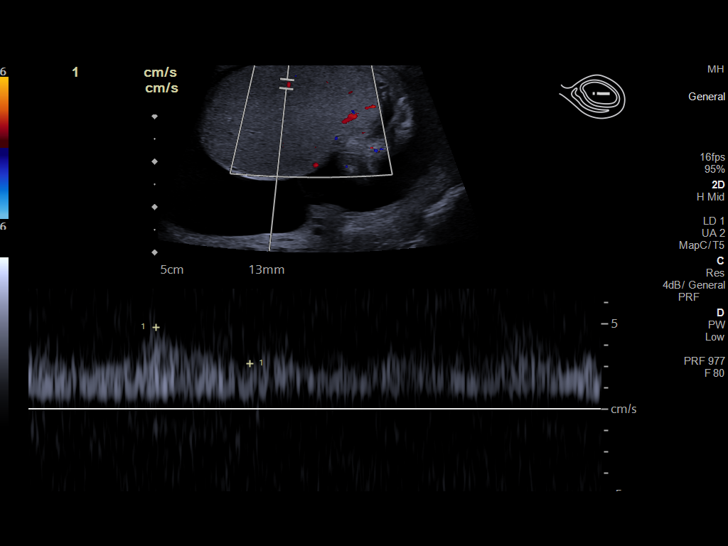
[im 81/162]
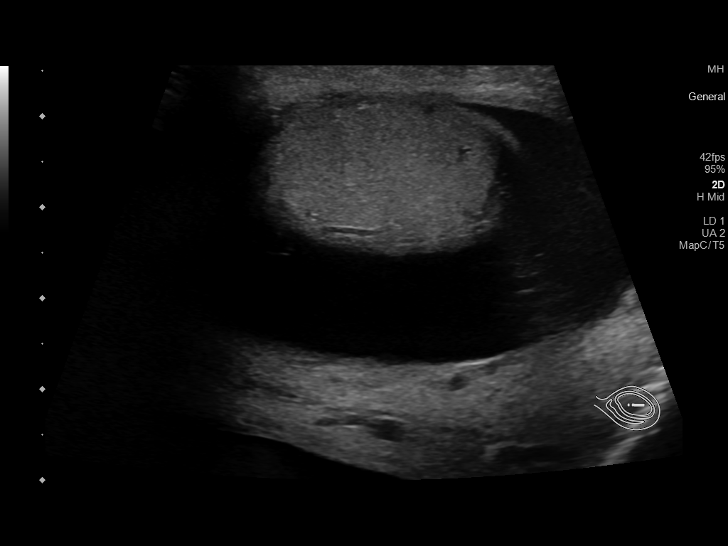
[im 94/162]
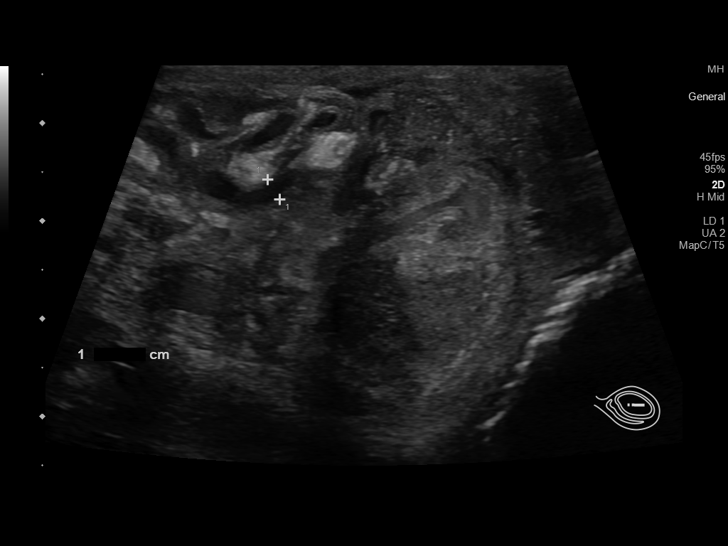
[im 108/162]
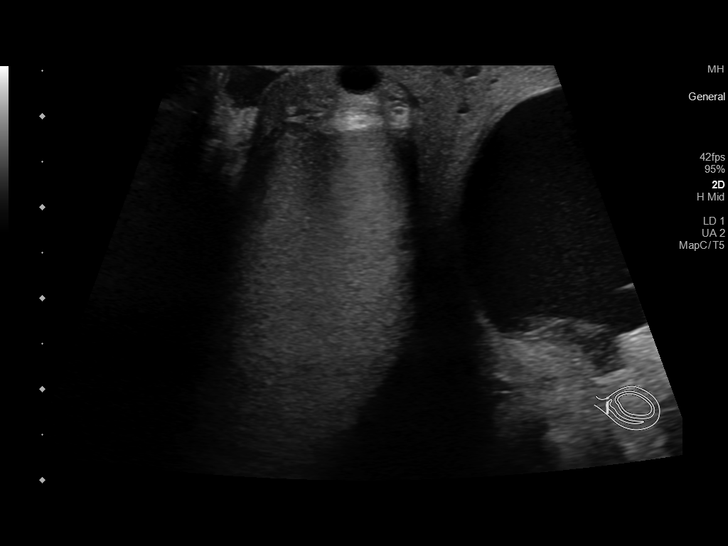
[im 121/162]
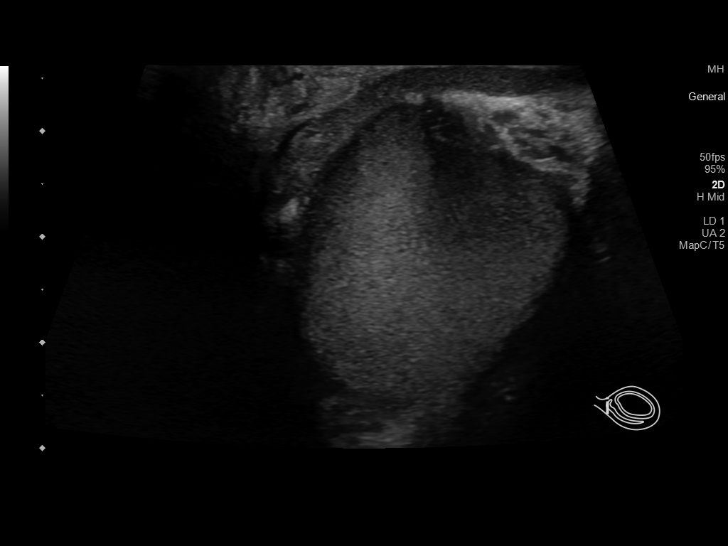
[im 135/162]
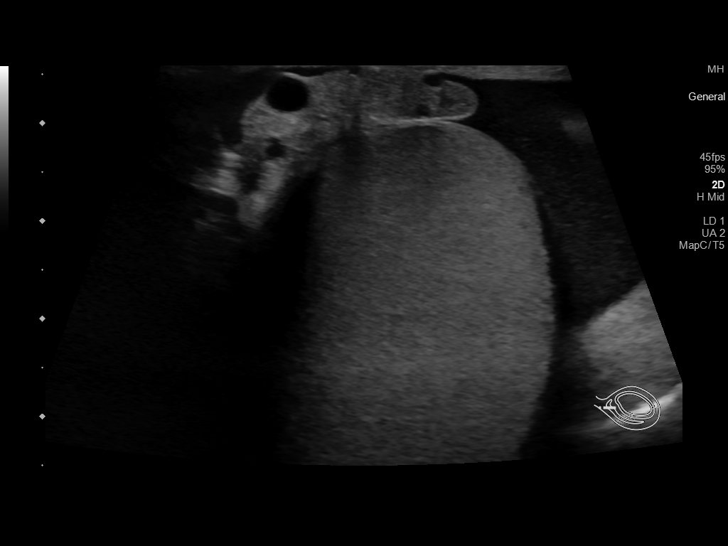
[im 148/162]
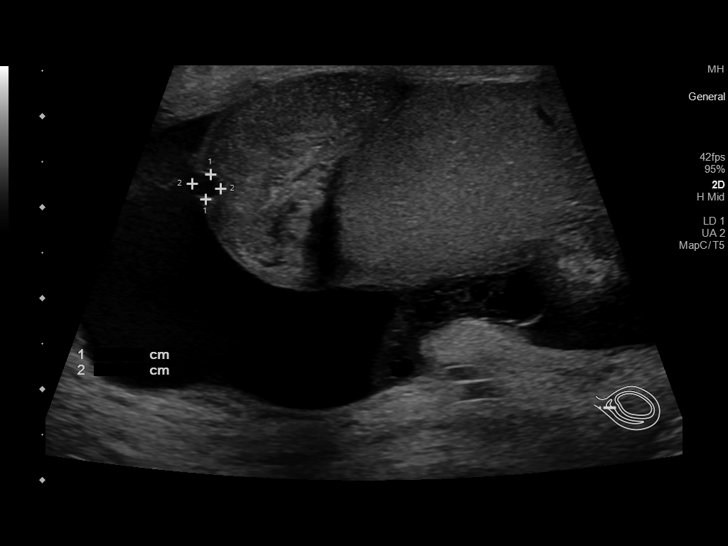
[im 162/162]
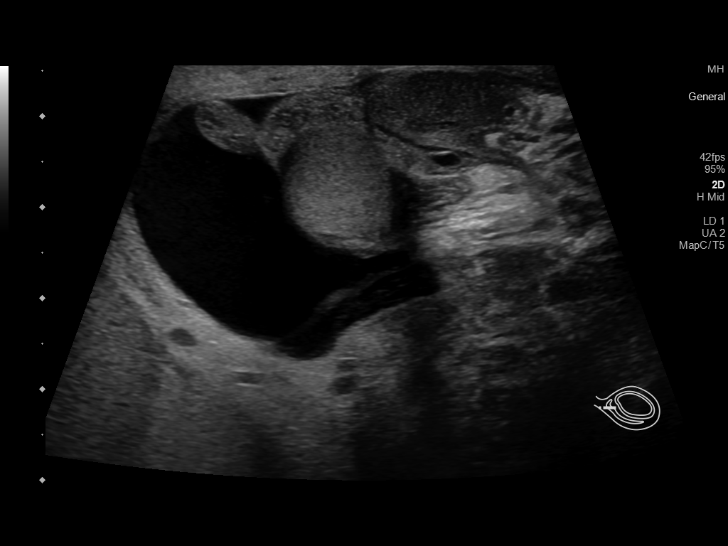

[13 of 25 positions shown; findings below may reference images not displayed]

FINDINGS: Right testicle

Measurements: 4.7 x 2.4 x 3.2 cm. No mass or microlithiasis
visualized.

Left testicle

Measurements: 4.2 x 2.5 x 2.9 cm. No mass or microlithiasis
visualized.

Right epididymis: Normal in size and echogenicity. 6 mm simple
appearing cyst. Normal vascularity.

Left epididymis: Increased in size when compared to the right as
well as heterogeneous and demonstrating increased vascularity. 3 mm
cyst.

Hydrocele: Bilateral hydroceles small on the right and moderate to
large on the left. Left with a septation.

Varicocele:  Bilateral varicoceles.

Pulsed Doppler interrogation of both testes demonstrates normal low
resistance arterial and venous waveforms bilaterally.
IMPRESSION: 1. Acute left epididymitis.
2. No other acute abnormalities. No testicular masses or evidence of
torsion.
3. Moderate to large left and small right hydroceles. Small
bilateral epididymal cysts.
4. Bilateral varicoceles.

## 2022-06-30 ENCOUNTER — Emergency Department (HOSPITAL_COMMUNITY): Payer: Managed Care, Other (non HMO)

## 2022-06-30 ENCOUNTER — Other Ambulatory Visit: Payer: Self-pay

## 2022-06-30 ENCOUNTER — Encounter (HOSPITAL_COMMUNITY): Payer: Self-pay

## 2022-06-30 ENCOUNTER — Emergency Department (HOSPITAL_COMMUNITY)
Admission: EM | Admit: 2022-06-30 | Discharge: 2022-06-30 | Disposition: A | Discharge status: Home / Self Care | Payer: Managed Care, Other (non HMO) | Attending: Emergency Medicine | Admitting: Emergency Medicine

## 2022-06-30 DIAGNOSIS — M542 Cervicalgia: Secondary | ICD-10-CM | POA: Diagnosis not present

## 2022-06-30 DIAGNOSIS — M791 Myalgia, unspecified site: Secondary | ICD-10-CM | POA: Diagnosis not present

## 2022-06-30 DIAGNOSIS — Y92410 Unspecified street and highway as the place of occurrence of the external cause: Secondary | ICD-10-CM | POA: Insufficient documentation

## 2022-06-30 DIAGNOSIS — R0789 Other chest pain: Secondary | ICD-10-CM | POA: Insufficient documentation

## 2022-06-30 DIAGNOSIS — R519 Headache, unspecified: Secondary | ICD-10-CM | POA: Insufficient documentation

## 2022-06-30 DIAGNOSIS — M7918 Myalgia, other site: Secondary | ICD-10-CM

## 2022-06-30 MED ORDER — CYCLOBENZAPRINE HCL 10 MG PO TABS: 10.0000 mg | ORAL_TABLET | Freq: Two times a day (BID) | ORAL | 0 refills | Status: DC | PRN

## 2022-06-30 MED ORDER — IBUPROFEN 800 MG PO TABS
800.0000 mg | ORAL_TABLET | Freq: Once | ORAL | Status: AC
  Administered 2022-06-30: 800 mg via ORAL
  Filled 2022-06-30: qty 1

## 2022-06-30 NOTE — ED Notes (Signed)
Pt ambulated from ED with steady gait. Pt had access to home. Pt dressed for discharge.
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
## 2022-10-23 ENCOUNTER — Ambulatory Visit: Payer: Managed Care, Other (non HMO)

## 2022-10-23 ENCOUNTER — Ambulatory Visit
Admission: EM | Admit: 2022-10-23 | Discharge: 2022-10-23 | Disposition: A | Discharge status: Home / Self Care | Payer: Managed Care, Other (non HMO) | Attending: Internal Medicine | Admitting: Internal Medicine

## 2022-10-23 DIAGNOSIS — R0602 Shortness of breath: Secondary | ICD-10-CM | POA: Diagnosis not present

## 2022-10-23 DIAGNOSIS — J029 Acute pharyngitis, unspecified: Secondary | ICD-10-CM | POA: Insufficient documentation

## 2022-10-23 DIAGNOSIS — B349 Viral infection, unspecified: Secondary | ICD-10-CM | POA: Diagnosis present

## 2022-10-23 LAB — POCT RAPID STREP A (OFFICE): Rapid Strep A Screen: NEGATIVE

## 2022-10-23 MED ORDER — ALBUTEROL SULFATE HFA 108 (90 BASE) MCG/ACT IN AERS: 1.0000 | INHALATION_SPRAY | Freq: Four times a day (QID) | RESPIRATORY_TRACT | 0 refills | Status: DC | PRN

## 2022-10-23 MED ORDER — PREDNISONE 20 MG PO TABS: 40.0000 mg | ORAL_TABLET | Freq: Every day | ORAL | 0 refills | Status: AC

## 2022-10-23 NOTE — ED Provider Notes (Signed)
[name]    CSN: [id number] Arrival date & time: 10/23/22  0915      History   Chief Complaint Chief Complaint  Patient presents with   Chills   Headache    HPI Derek Chan is a 47 y.o. male.   Patient presents with chills, headache, chest tightness, shortness of breath, scratchy throat, nasal congestion that started about 6 days ago.  Patient denies any known sick contacts.  Reports that he felt feverish but did not take temperature with thermometer.  Reports that he has shortness of breath with inspiration mainly on the left side of the chest that started with symptoms.  Denies any associated chest pain.  Denies history of asthma or COPD.  Denies nausea, vomiting, diarrhea, abdominal pain.  Has taken TheraFlu for symptoms.   Headache   Past Medical History:  Diagnosis Date   Migraine     There are no problems to display for this patient.   Past Surgical History:  Procedure Laterality Date   surgery for blood clot removal from the right  buttock         Home Medications    Prior to Admission medications   Medication Sig Start Date End Date Taking? Authorizing Provider  albuterol (VENTOLIN HFA) 108 (90 Base) MCG/ACT inhaler Inhale 1-2 puffs into the lungs every 6 (six) hours as needed for wheezing or shortness of breath. 10/23/22  Yes Kurk Corniel, Hildred Alamin E, FNP  predniSONE (DELTASONE) 20 MG tablet Take 2 tablets (40 mg total) by mouth daily for 5 days. 10/23/22 10/28/22 Yes Leoncio Hansen, Michele Rockers, FNP  acetaminophen (TYLENOL) 325 MG tablet Take 650 mg by mouth every 6 (six) hours as needed. For pain    [provider]  cyclobenzaprine (FLEXERIL) 10 MG tablet Take 1 tablet (10 mg total) by mouth 2 (two) times daily as needed for muscle spasms. 06/30/22   Azucena Cecil, PA-C  doxycycline (VIBRAMYCIN) 100 MG capsule Take 1 capsule (100 mg total) by mouth 2 (two) times daily. 02/23/19   Law, Bea Graff, PA-C  HYDROcodone-acetaminophen (NORCO/VICODIN) 5-325  MG tablet Take 1 tablet by mouth every 6 (six) hours as needed for severe pain. 02/23/19   Law, Bea Graff, PA-C  ibuprofen (ADVIL) 600 MG tablet Take 1 tablet (600 mg total) by mouth every 6 (six) hours as needed. 02/23/19   Frederica Kuster, PA-C    Family History Family History  Family history unknown: Yes    Social History Social History   Tobacco Use   Smoking status: Every Day    Packs/day: 0.25    Types: Cigarettes   Smokeless tobacco: Never  Vaping Use   Vaping Use: Never used  Substance Use Topics   Alcohol use: No   Drug use: Not Currently    Types: Marijuana     Allergies   Patient has no known allergies.   Review of Systems Review of Systems Per HPI  Physical Exam Triage Vital Signs ED Triage Vitals  Enc Vitals Group     BP 10/23/22 0944 114/76     Pulse Rate 10/23/22 0944 81     Resp 10/23/22 0944 18     Temp 10/23/22 0944 99 F (37.2 C)     Temp Source 10/23/22 0944 Oral     SpO2 10/23/22 0944 97 %     Weight --      Height --      Head Circumference --      Peak Flow --  Pain Score 10/23/22 0943 6     Pain Loc --      Pain Edu? --      Excl. in [place]? --    No data found.  Updated Vital Signs BP 114/76 (BP Location: Left Arm)   Pulse 81   Temp 99 F (37.2 C) (Oral)   Resp 18   SpO2 97%   Visual Acuity Right Eye Distance:   Left Eye Distance:   Bilateral Distance:    Right Eye Near:   Left Eye Near:    Bilateral Near:     Physical Exam Constitutional:      General: He is not in acute distress.    Appearance: Normal appearance. He is not toxic-appearing or diaphoretic.  HENT:     Head: Normocephalic and atraumatic.     Right Ear: Tympanic membrane and ear canal normal.     Left Ear: Tympanic membrane and ear canal normal.     Nose: Congestion present.     Mouth/Throat:     Mouth: Mucous membranes are moist.     Pharynx: Posterior oropharyngeal erythema present.  Eyes:     Extraocular Movements: Extraocular movements  intact.     Conjunctiva/sclera: Conjunctivae normal.     Pupils: Pupils are equal, round, and reactive to light.  Cardiovascular:     Rate and Rhythm: Normal rate and regular rhythm.     Pulses: Normal pulses.     Heart sounds: Normal heart sounds.  Pulmonary:     Effort: Pulmonary effort is normal. No respiratory distress.     Breath sounds: Normal breath sounds. No stridor. No wheezing, rhonchi or rales.  Abdominal:     General: Abdomen is flat. Bowel sounds are normal.     Palpations: Abdomen is soft.  Musculoskeletal:        General: Normal range of motion.     Cervical back: Normal range of motion.  Skin:    General: Skin is warm and dry.  Neurological:     General: No focal deficit present.     Mental Status: He is alert and oriented to person, place, and time. Mental status is at baseline.  Psychiatric:        Mood and Affect: Mood normal.        Behavior: Behavior normal.      UC Treatments / Results  Labs (all labs ordered are listed, but only abnormal results are displayed) Labs Reviewed  CULTURE, GROUP A STREP [facility])  POCT RAPID STREP A (OFFICE)    EKG   Radiology DG Chest 2 View  Result Date: 10/23/2022 CLINICAL DATA:  Shortness of breath EXAM: CHEST - 2 VIEW COMPARISON:  Chest radiograph June 30, 2022. FINDINGS: The heart size and mediastinal contours are within normal limits. No focal consolidation. No pleural effusion. No pneumothorax. The visualized skeletal structures are unremarkable. IMPRESSION: No active cardiopulmonary disease. Electronically Signed   By: Dahlia Bailiff M.D.   On: 10/23/2022 10:22    Procedures Procedures (including critical care time)  Medications Ordered in UC Medications - No data to display  Initial Impression / Assessment and Plan / UC Course  I have reviewed the triage vital signs and the nursing notes.  Pertinent labs & imaging results that were available during my care of the patient were reviewed by me and  considered in my medical decision making (see chart for details).     It appears the patient has viral illness causing the symptoms.  Chest x-ray  was completed given patient's shortness of breath.  It was negative for any acute cardiopulmonary process.  Suspect patient's chest tightness/shortness of breath is due to inflammation.  No concern for cardiac etiology at this time.  Will treat with albuterol and prednisone.  Rapid strep was negative.  Throat culture pending.  Discussed with patient that viral testing is not necessary given duration of symptoms as it would not change treatment.  Discussed supportive care.  Discussed strict return and ER precautions.  Patient verbalized understanding and was agreeable with plan. Final Clinical Impressions(s) / UC Diagnoses   Final diagnoses:  Viral illness  Sore throat     Discharge Instructions      Your chest x-ray was normal.  Rapid strep is negative.  It appears that you have a persistent viral illness.  I have prescribed prednisone steroid and albuterol inhaler.  Please follow-up if any symptoms persist or worsen.     ED Prescriptions     Medication Sig Dispense Auth. Provider   predniSONE (DELTASONE) 20 MG tablet Take 2 tablets (40 mg total) by mouth daily for 5 days. 10 tablet Pettus, Hildred Alamin E, [place]   albuterol (VENTOLIN HFA) 108 (90 Base) MCG/ACT inhaler Inhale 1-2 puffs into the lungs every 6 (six) hours as needed for wheezing or shortness of breath. 1 each Teodora Medici, [place]      PDMP not reviewed this encounter.   Teodora Medici, [place] 10/23/22 1046

## 2022-10-23 NOTE — ED Triage Notes (Signed)
Pt presents with ongoing chills, headache, and chest tightness for about 7 days.

## 2022-10-23 NOTE — Discharge Instructions (Signed)
Your chest x-ray was normal.  Rapid strep is negative.  It appears that you have a persistent viral illness.  I have prescribed prednisone steroid and albuterol inhaler.  Please follow-up if any symptoms persist or worsen.

## 2022-10-26 LAB — CULTURE, GROUP A STREP (THRC)

## 2022-11-20 ENCOUNTER — Ambulatory Visit
Admission: EM | Admit: 2022-11-20 | Discharge: 2022-11-20 | Disposition: A | Discharge status: Home / Self Care | Payer: Managed Care, Other (non HMO) | Attending: Family Medicine | Admitting: Family Medicine

## 2022-11-20 ENCOUNTER — Encounter: Payer: Self-pay | Admitting: Emergency Medicine

## 2022-11-20 DIAGNOSIS — R310 Gross hematuria: Secondary | ICD-10-CM | POA: Insufficient documentation

## 2022-11-20 LAB — POCT URINALYSIS DIP (MANUAL ENTRY)
Bilirubin, UA: NEGATIVE
Glucose, UA: NEGATIVE mg/dL
Ketones, POC UA: NEGATIVE mg/dL
Nitrite, UA: NEGATIVE
Protein Ur, POC: 30 mg/dL — AB
Spec Grav, UA: 1.02 (ref 1.010–1.025)
Urobilinogen, UA: 0.2 E.U./dL
pH, UA: 7 (ref 5.0–8.0)

## 2022-11-20 MED ORDER — KETOROLAC TROMETHAMINE 30 MG/ML IJ SOLN
30.0000 mg | Freq: Once | INTRAMUSCULAR | Status: AC
  Administered 2022-11-20: 30 mg via INTRAMUSCULAR

## 2022-11-20 MED ORDER — TRAMADOL HCL 50 MG PO TABS: 50.0000 mg | ORAL_TABLET | Freq: Four times a day (QID) | ORAL | 0 refills | Status: DC | PRN

## 2022-11-20 MED ORDER — CIPROFLOXACIN HCL 500 MG PO TABS: 500.0000 mg | ORAL_TABLET | Freq: Two times a day (BID) | ORAL | 0 refills | Status: AC

## 2022-11-20 MED ORDER — TAMSULOSIN HCL 0.4 MG PO CAPS: 0.4000 mg | ORAL_CAPSULE | Freq: Every day | ORAL | 0 refills | Status: AC

## 2022-11-20 MED ORDER — ONDANSETRON 4 MG PO TBDP: 4.0000 mg | ORAL_TABLET | Freq: Three times a day (TID) | ORAL | 0 refills | Status: AC | PRN

## 2022-11-20 NOTE — ED Triage Notes (Signed)
Reports central lower abdominal pain with noticeable hematuria over the last couple days. Reports he is able to void and feels like he is fully emptying, the pain is constant. Denies taking any meds at home to help. Reports he has had kidney stones in the past, but that this pain is much worse.

## 2022-11-20 NOTE — ED Provider Notes (Signed)
[name]    CSN: [id number] Arrival date & time: 11/20/22  0840      History   Chief Complaint Chief Complaint  Patient presents with   Hematuria   Abdominal Pain    HPI Derek Chan is a 47 y.o. male.    Hematuria Associated symptoms include abdominal pain.  Abdominal Pain Associated symptoms: hematuria    Here for hematuria and pain in his suprapubic area. He first noted a little discomfort and blood in his urine on March 13.  It improved some yesterday and then overnight and this morning he began having pretty intense pain in his suprapubic area and lots of blood in his urine.  It also hurts to urinate.  His temperature here is 99.6.  He did vomit once.  He has had kidney stones in the past as he has had some blood in sand-like material in his urine a couple of times in the past.  He is not been diagnosed with any imaging.     Past Medical History:  Diagnosis Date   Migraine     There are no problems to display for this patient.   Past Surgical History:  Procedure Laterality Date   surgery for blood clot removal from the right  buttock         Home Medications    Prior to Admission medications   Medication Sig Start Date End Date Taking? Authorizing Provider  acetaminophen (TYLENOL) 325 MG tablet Take 650 mg by mouth every 6 (six) hours as needed. For pain   Yes [provider]  ciprofloxacin (CIPRO) 500 MG tablet Take 1 tablet (500 mg total) by mouth 2 (two) times daily for 7 days. 11/20/22 11/27/22 Yes Barrett Henle, MD  ondansetron (ZOFRAN-ODT) 4 MG disintegrating tablet Take 1 tablet (4 mg total) by mouth every 8 (eight) hours as needed for nausea or vomiting. 11/20/22  Yes Justun Anaya, Gwenlyn Perking, MD  tamsulosin (FLOMAX) 0.4 MG CAPS capsule Take 1 capsule (0.4 mg total) by mouth daily. 11/20/22  Yes Addie Alonge, Gwenlyn Perking, MD  traMADol (ULTRAM) 50 MG tablet Take 1 tablet (50 mg total) by mouth every 6 (six) hours as needed (pain).  11/20/22  Yes Barrett Henle, MD  albuterol (VENTOLIN HFA) 108 (90 Base) MCG/ACT inhaler Inhale 1-2 puffs into the lungs every 6 (six) hours as needed for wheezing or shortness of breath. 10/23/22   Teodora Medici, FNP    Family History Family History  Family history unknown: Yes    Social History Social History   Tobacco Use   Smoking status: Every Day    Packs/day: .25    Types: Cigarettes   Smokeless tobacco: Never  Vaping Use   Vaping Use: Never used  Substance Use Topics   Alcohol use: No   Drug use: Not Currently    Types: Marijuana     Allergies   Patient has no known allergies.   Review of Systems Review of Systems  Gastrointestinal:  Positive for abdominal pain.  Genitourinary:  Positive for hematuria.     Physical Exam Triage Vital Signs ED Triage Vitals  Enc Vitals Group     BP 11/20/22 1010 135/82     Pulse Rate 11/20/22 1010 75     Resp 11/20/22 1010 16     Temp 11/20/22 1010 99.6 F (37.6 C)     Temp Source 11/20/22 1010 Oral     SpO2 11/20/22 1010 97 %     Weight --  Height --      Head Circumference --      Peak Flow --      Pain Score 11/20/22 1013 10     Pain Loc --      Pain Edu? --      Excl. in [place]? --    No data found.  Updated Vital Signs BP 135/82 (BP Location: Left Arm)   Pulse 75   Temp 99.6 F (37.6 C) (Oral)   Resp 16   SpO2 97%   Visual Acuity Right Eye Distance:   Left Eye Distance:   Bilateral Distance:    Right Eye Near:   Left Eye Near:    Bilateral Near:     Physical Exam Vitals reviewed.  Constitutional:      General: He is not in acute distress.    Appearance: He is not ill-appearing, toxic-appearing or diaphoretic.  HENT:     Mouth/Throat:     Mouth: Mucous membranes are moist.  Eyes:     Extraocular Movements: Extraocular movements intact.     Conjunctiva/sclera: Conjunctivae normal.     Pupils: Pupils are equal, round, and reactive to light.  Cardiovascular:     Rate and Rhythm: Normal  rate and regular rhythm.     Heart sounds: No murmur heard. Pulmonary:     Effort: Pulmonary effort is normal.     Breath sounds: Normal breath sounds.  Abdominal:     General: There is no distension.     Palpations: Abdomen is soft.     Tenderness: There is abdominal tenderness (Very tender in his suprapubic area.).  Musculoskeletal:     Cervical back: Neck supple.  Lymphadenopathy:     Cervical: No cervical adenopathy.  Skin:    Capillary Refill: Capillary refill takes less than 2 seconds.     Coloration: Skin is not pale.  Neurological:     General: No focal deficit present.     Mental Status: He is alert and oriented to person, place, and time.  Psychiatric:        Behavior: Behavior normal.      UC Treatments / Results  Labs (all labs ordered are listed, but only abnormal results are displayed) Labs Reviewed  POCT URINALYSIS DIP (MANUAL ENTRY) - Abnormal; Notable for the following components:      Result Value   Clarity, UA cloudy (*)    Blood, UA moderate (*)    Protein Ur, POC =30 (*)    Leukocytes, UA Large (3+) (*)    All other components within normal limits  URINE CULTURE    EKG   Radiology No results found.  Procedures Procedures (including critical care time)  Medications Ordered in UC Medications  ketorolac (TORADOL) 30 MG/ML injection 30 mg (has no administration in time range)    Initial Impression / Assessment and Plan / UC Course  I have reviewed the triage vital signs and the nursing notes.  Pertinent labs & imaging results that were available during my care of the patient were reviewed by me and considered in my medical decision making (see chart for details).        The urinalysis shows a large amount of blood and white cells.  I am going to treat for possible urinary infection/prostatitis with Cipro.  Also prescriptions are sent in for Flomax and for some tramadol for pain relief and possible bladder stone.  He is given contact  information for urology.  If worsening in any way he  is to present to the emergency room Final Clinical Impressions(s) / UC Diagnoses   Final diagnoses:  Gross hematuria     Discharge Instructions      The urinalysis had white blood cells and red blood cells.  This could be a sign of urinary infection or of kidney stone/bladder stone.  Take Cipro 500 mg--1 tablet 2 times daily for 7 days  Tamsulosin 0.4 mg--1 daily.  This is to help urinary flow  Ondansetron dissolved in the mouth every 8 hours as needed for nausea or vomiting. Clear liquids and bland things to eat. Avoid acidic foods like lemon/lime/orange/tomato.  Take tramadol 50 mg-- 1 tablet every 6 hours as needed for pain.  This medication can make you sleepy or dizzy      ED Prescriptions     Medication Sig Dispense Auth. Provider   ciprofloxacin (CIPRO) 500 MG tablet Take 1 tablet (500 mg total) by mouth 2 (two) times daily for 7 days. 14 tablet Jayln Branscom, Gwenlyn Perking, MD   tamsulosin (FLOMAX) 0.4 MG CAPS capsule Take 1 capsule (0.4 mg total) by mouth daily. 30 capsule Barrett Henle, MD   traMADol (ULTRAM) 50 MG tablet Take 1 tablet (50 mg total) by mouth every 6 (six) hours as needed (pain). 12 tablet Verlyn Lambert, Gwenlyn Perking, MD   ondansetron (ZOFRAN-ODT) 4 MG disintegrating tablet Take 1 tablet (4 mg total) by mouth every 8 (eight) hours as needed for nausea or vomiting. 10 tablet Windy Carina Gwenlyn Perking, MD      I have reviewed the PDMP during this encounter.   Barrett Henle, MD 11/20/22 1038

## 2022-11-20 NOTE — Discharge Instructions (Signed)
The urinalysis had white blood cells and red blood cells.  This could be a sign of urinary infection or of kidney stone/bladder stone.  Take Cipro 500 mg--1 tablet 2 times daily for 7 days  Tamsulosin 0.4 mg--1 daily.  This is to help urinary flow  Ondansetron dissolved in the mouth every 8 hours as needed for nausea or vomiting. Clear liquids and bland things to eat. Avoid acidic foods like lemon/lime/orange/tomato.  Take tramadol 50 mg-- 1 tablet every 6 hours as needed for pain.  This medication can make you sleepy or dizzy

## 2022-11-22 LAB — URINE CULTURE: Culture: 60000 — AB

## 2022-12-28 ENCOUNTER — Encounter: Payer: Self-pay | Admitting: Family Medicine

## 2022-12-28 ENCOUNTER — Ambulatory Visit (INDEPENDENT_AMBULATORY_CARE_PROVIDER_SITE_OTHER): Payer: Managed Care, Other (non HMO) | Admitting: Family Medicine

## 2022-12-28 VITALS — BP 120/79 | HR 67 | Temp 98.0°F | Resp 16 | Ht 71.75 in | Wt 183.6 lb

## 2022-12-28 DIAGNOSIS — F17210 Nicotine dependence, cigarettes, uncomplicated: Secondary | ICD-10-CM

## 2022-12-28 DIAGNOSIS — Z0000 Encounter for general adult medical examination without abnormal findings: Secondary | ICD-10-CM | POA: Diagnosis not present

## 2022-12-28 DIAGNOSIS — Z7689 Persons encountering health services in other specified circumstances: Secondary | ICD-10-CM

## 2022-12-28 DIAGNOSIS — Z114 Encounter for screening for human immunodeficiency virus [HIV]: Secondary | ICD-10-CM

## 2022-12-28 DIAGNOSIS — Z130 Encounter for screening for diseases of the blood and blood-forming organs and certain disorders involving the immune mechanism: Secondary | ICD-10-CM

## 2022-12-28 DIAGNOSIS — Z1211 Encounter for screening for malignant neoplasm of colon: Secondary | ICD-10-CM

## 2022-12-28 DIAGNOSIS — Z13220 Encounter for screening for lipoid disorders: Secondary | ICD-10-CM

## 2022-12-28 DIAGNOSIS — Z1159 Encounter for screening for other viral diseases: Secondary | ICD-10-CM | POA: Diagnosis not present

## 2022-12-28 NOTE — Progress Notes (Unsigned)
Patient is here to established care with provider today. Patient has many health concern they would like to discuss with provider today  Care gaps discuss at appointment today  

## 2022-12-28 NOTE — Progress Notes (Unsigned)
   New Patient Office Visit  Subjective    Patient ID: Derek Chan, male    DOB: 01-05-1976  Age: 47 y.o. MRN: [id number]  CC:  Chief Complaint  Patient presents with   Establish Care    HPI Derek Chan presents to establish care   Outpatient Encounter Medications as of 12/28/2022  Medication Sig   acetaminophen (TYLENOL) 325 MG tablet Take 650 mg by mouth every 6 (six) hours as needed. For pain   albuterol (VENTOLIN HFA) 108 (90 Base) MCG/ACT inhaler Inhale 1-2 puffs into the lungs every 6 (six) hours as needed for wheezing or shortness of breath.   ondansetron (ZOFRAN-ODT) 4 MG disintegrating tablet Take 1 tablet (4 mg total) by mouth every 8 (eight) hours as needed for nausea or vomiting.   tamsulosin (FLOMAX) 0.4 MG CAPS capsule Take 1 capsule (0.4 mg total) by mouth daily.   traMADol (ULTRAM) 50 MG tablet Take 1 tablet (50 mg total) by mouth every 6 (six) hours as needed (pain).   No facility-administered encounter medications on file as of 12/28/2022.    Past Medical History:  Diagnosis Date   Migraine     Past Surgical History:  Procedure Laterality Date   surgery for blood clot removal from the right  buttock      Family History  Family history unknown: Yes    Social History   Socioeconomic History   Marital status: Single    Spouse name: Not on file   Number of children: Not on file   Years of education: Not on file   Highest education level: Not on file  Occupational History   Not on file  Tobacco Use   Smoking status: Every Day    Packs/day: .25    Types: Cigarettes   Smokeless tobacco: Never  Vaping Use   Vaping Use: Never used  Substance and Sexual Activity   Alcohol use: No   Drug use: Not Currently    Types: Marijuana   Sexual activity: Yes  Other Topics Concern   Not on file  Social History Narrative   Not on file   Social Determinants of Health   Financial Resource Strain: Not on file  Food Insecurity: Not on file  Transportation  Needs: Not on file  Physical Activity: Not on file  Stress: Not on file  Social Connections: Not on file  Intimate Partner Violence: Not on file    ROS      Objective    BP 120/79   Pulse 67   Temp 98 F (36.7 C) (Oral)   Resp 16   Ht 5' 11.75" (1.822 m)   Wt 183 lb 9.6 oz (83.3 kg)   SpO2 98%   BMI 25.07 kg/m   Physical Exam  {Labs (Optional):23779}    Assessment & Plan:   Problem List Items Addressed This Visit   None   No follow-ups on file.   Derek Raymond, MD

## 2022-12-29 LAB — CMP14+EGFR
ALT: 16 IU/L (ref 0–44)
AST: 18 IU/L (ref 0–40)
Albumin/Globulin Ratio: 1.6 (ref 1.2–2.2)
Albumin: 4.2 g/dL (ref 4.1–5.1)
Alkaline Phosphatase: 93 IU/L (ref 44–121)
BUN/Creatinine Ratio: 13 (ref 9–20)
BUN: 12 mg/dL (ref 6–24)
Bilirubin Total: <0.2 mg/dL (ref 0.0–1.2)
CO2: 25 mmol/L (ref 20–29)
Calcium: 9.4 mg/dL (ref 8.7–10.2)
Chloride: 103 mmol/L (ref 96–106)
Creatinine, Ser: 0.9 mg/dL (ref 0.76–1.27)
Globulin, Total: 2.6 g/dL (ref 1.5–4.5)
Glucose: 81 mg/dL (ref 70–99)
Potassium: 4.6 mmol/L (ref 3.5–5.2)
Sodium: 144 mmol/L (ref 134–144)
Total Protein: 6.8 g/dL (ref 6.0–8.5)
eGFR: 106 mL/min/{1.73_m2} (ref >59)

## 2022-12-29 LAB — CBC WITH DIFFERENTIAL/PLATELET
Basophils Absolute: 0 10*3/uL (ref 0.0–0.2)
Basos: 1 %
EOS (ABSOLUTE): 0.2 10*3/uL (ref 0.0–0.4)
Eos: 5 %
Hematocrit: 39.4 % (ref 37.5–51.0)
Hemoglobin: 13 g/dL (ref 13.0–17.7)
Immature Grans (Abs): 0 10*3/uL (ref 0.0–0.1)
Immature Granulocytes: 0 %
Lymphocytes Absolute: 2 10*3/uL (ref 0.7–3.1)
Lymphs: 46 %
MCH: 28.8 pg (ref 26.6–33.0)
MCHC: 33 g/dL (ref 31.5–35.7)
MCV: 87 fL (ref 79–97)
Monocytes Absolute: 0.4 10*3/uL (ref 0.1–0.9)
Monocytes: 10 %
Neutrophils Absolute: 1.6 10*3/uL (ref 1.4–7.0)
Neutrophils: 38 %
Platelets: 202 10*3/uL (ref 150–450)
RBC: 4.51 x10E6/uL (ref 4.14–5.80)
RDW: 12.9 % (ref 11.6–15.4)
WBC: 4.3 10*3/uL (ref 3.4–10.8)

## 2022-12-29 LAB — HIV ANTIBODY (ROUTINE TESTING W REFLEX): HIV Screen 4th Generation wRfx: NONREACTIVE

## 2022-12-29 LAB — LIPID PANEL
Chol/HDL Ratio: 3 ratio (ref 0.0–5.0)
Cholesterol, Total: 162 mg/dL (ref 100–199)
HDL: 54 mg/dL (ref >39)
LDL Chol Calc (NIH): 94 mg/dL (ref 0–99)
Triglycerides: 74 mg/dL (ref 0–149)
VLDL Cholesterol Cal: 14 mg/dL (ref 5–40)

## 2022-12-29 LAB — HEPATITIS C ANTIBODY: Hep C Virus Ab: NONREACTIVE

## 2022-12-30 ENCOUNTER — Encounter: Payer: Self-pay | Admitting: Family Medicine

## 2023-02-04 ENCOUNTER — Ambulatory Visit: Payer: Managed Care, Other (non HMO) | Admitting: Family Medicine

## 2023-03-09 ENCOUNTER — Encounter: Payer: Self-pay | Admitting: Family Medicine

## 2023-03-09 ENCOUNTER — Ambulatory Visit: Payer: Managed Care, Other (non HMO) | Admitting: Family Medicine

## 2023-03-09 VITALS — BP 112/76 | HR 76 | Temp 98.1°F | Resp 16 | Wt 190.8 lb

## 2023-03-09 DIAGNOSIS — M5441 Lumbago with sciatica, right side: Secondary | ICD-10-CM

## 2023-03-09 DIAGNOSIS — M5442 Lumbago with sciatica, left side: Secondary | ICD-10-CM

## 2023-03-09 MED ORDER — TRAMADOL HCL 50 MG PO TABS: 50.0000 mg | ORAL_TABLET | Freq: Four times a day (QID) | ORAL | 0 refills | Status: AC | PRN

## 2023-03-10 ENCOUNTER — Encounter: Payer: Self-pay | Admitting: Family Medicine

## 2023-03-10 NOTE — Progress Notes (Signed)
Established Patient Office Visit  Subjective    Patient ID: Derek Chan, male    DOB: June 05, 1976  Age: 47 y.o. MRN: [id number]  CC: No chief complaint on file.   HPI Dewand Verdell presents with low back pain that is worsening. Patient reports sx started when lifting at work.    Outpatient Encounter Medications as of 03/09/2023  Medication Sig   acetaminophen (TYLENOL) 325 MG tablet Take 650 mg by mouth every 6 (six) hours as needed. For pain   ondansetron (ZOFRAN-ODT) 4 MG disintegrating tablet Take 1 tablet (4 mg total) by mouth every 8 (eight) hours as needed for nausea or vomiting.   tamsulosin (FLOMAX) 0.4 MG CAPS capsule Take 1 capsule (0.4 mg total) by mouth daily.   [DISCONTINUED] traMADol (ULTRAM) 50 MG tablet Take 1 tablet (50 mg total) by mouth every 6 (six) hours as needed (pain).   traMADol (ULTRAM) 50 MG tablet Take 1 tablet (50 mg total) by mouth every 6 (six) hours as needed (pain).   No facility-administered encounter medications on file as of 03/09/2023.    Past Medical History:  Diagnosis Date   Migraine     Past Surgical History:  Procedure Laterality Date   surgery for blood clot removal from the right  buttock      Family History  Family history unknown: Yes    Social History   Socioeconomic History   Marital status: Single    Spouse name: Not on file   Number of children: Not on file   Years of education: Not on file   Highest education level: Not on file  Occupational History   Not on file  Tobacco Use   Smoking status: Every Day    Packs/day: .25    Types: Cigarettes   Smokeless tobacco: Never  Vaping Use   Vaping Use: Never used  Substance and Sexual Activity   Alcohol use: No   Drug use: Not Currently    Types: Marijuana   Sexual activity: Yes  Other Topics Concern   Not on file  Social History Narrative   Not on file   Social Determinants of Health   Financial Resource Strain: Not on file  Food Insecurity: Not on file   Transportation Needs: Not on file  Physical Activity: Not on file  Stress: Not on file  Social Connections: Not on file  Intimate Partner Violence: Not on file    Review of Systems  Musculoskeletal:  Positive for back pain.  All other systems reviewed and are negative.       Objective    BP 112/76   Pulse 76   Temp 98.1 F (36.7 C) (Oral)   Resp 16   Wt 190 lb 12.8 oz (86.5 kg)   SpO2 97%   BMI 26.06 kg/m   Physical Exam Vitals and nursing note reviewed.  Constitutional:      General: He is not in acute distress. Cardiovascular:     Rate and Rhythm: Normal rate and regular rhythm.  Pulmonary:     Effort: Pulmonary effort is normal.     Breath sounds: Normal breath sounds.  Abdominal:     Palpations: Abdomen is soft.     Tenderness: There is no abdominal tenderness.  Musculoskeletal:     Lumbar back: Spasms and tenderness present. No deformity. Decreased range of motion.  Neurological:     General: No focal deficit present.     Mental Status: He is alert and oriented to person, place,  and time.         Assessment & Plan:   1. Midline low back pain with bilateral sciatica, unspecified chronicity Exercise given. Tylenol/nsaids prn. Tramadol prescribed. Referral for imaging.  - DG Lumbar Spine Complete; Future  Return in about 4 weeks (around 04/06/2023) for follow up.   Tommie Raymond, MD

## 2023-05-03 ENCOUNTER — Ambulatory Visit (INDEPENDENT_AMBULATORY_CARE_PROVIDER_SITE_OTHER): Payer: Managed Care, Other (non HMO)

## 2023-05-03 ENCOUNTER — Ambulatory Visit (INDEPENDENT_AMBULATORY_CARE_PROVIDER_SITE_OTHER): Payer: Managed Care, Other (non HMO) | Admitting: Family Medicine

## 2023-05-03 ENCOUNTER — Encounter: Payer: Self-pay | Admitting: Family Medicine

## 2023-05-03 VITALS — BP 120/72 | HR 73 | Temp 98.0°F | Resp 16 | Wt 190.0 lb

## 2023-05-03 DIAGNOSIS — M5441 Lumbago with sciatica, right side: Secondary | ICD-10-CM

## 2023-05-03 DIAGNOSIS — M5442 Lumbago with sciatica, left side: Secondary | ICD-10-CM

## 2023-05-03 NOTE — Progress Notes (Signed)
Patient had a accident at work for a dock plate that feel on leg. Patient said pain is when he moves it only (sharpe). Patient is afraid that it may be broken

## 2023-06-01 NOTE — Progress Notes (Signed)
Established Patient Office Visit  Subjective    Patient ID: Derek Chan, male    DOB: 1976-02-10  Age: 48 y.o. MRN: [id number]  CC: No chief complaint on file.   HPI Derek Chan presents for continued back pain.  Outpatient Encounter Medications as of 05/03/2023  Medication Sig   acetaminophen (TYLENOL) 325 MG tablet Take 650 mg by mouth every 6 (six) hours as needed. For pain (Patient not taking: Reported on 05/03/2023)   ondansetron (ZOFRAN-ODT) 4 MG disintegrating tablet Take 1 tablet (4 mg total) by mouth every 8 (eight) hours as needed for nausea or vomiting. (Patient not taking: Reported on 05/03/2023)   tamsulosin (FLOMAX) 0.4 MG CAPS capsule Take 1 capsule (0.4 mg total) by mouth daily. (Patient not taking: Reported on 05/03/2023)   traMADol (ULTRAM) 50 MG tablet Take 1 tablet (50 mg total) by mouth every 6 (six) hours as needed (pain). (Patient not taking: Reported on 05/03/2023)   No facility-administered encounter medications on file as of 05/03/2023.    Past Medical History:  Diagnosis Date   Migraine     Past Surgical History:  Procedure Laterality Date   surgery for blood clot removal from the right  buttock      Family History  Family history unknown: Yes    Social History   Socioeconomic History   Marital status: Single    Spouse name: Not on file   Number of children: Not on file   Years of education: Not on file   Highest education level: Not on file  Occupational History   Not on file  Tobacco Use   Smoking status: Every Day    Current packs/day: 0.25    Types: Cigarettes   Smokeless tobacco: Never  Vaping Use   Vaping status: Never Used  Substance and Sexual Activity   Alcohol use: No   Drug use: Not Currently    Types: Marijuana   Sexual activity: Yes  Other Topics Concern   Not on file  Social History Narrative   Not on file   Social Determinants of Health   Financial Resource Strain: Not on file  Food Insecurity: Not on file   Transportation Needs: Not on file  Physical Activity: Not on file  Stress: Not on file  Social Connections: Not on file  Intimate Partner Violence: Not on file    Review of Systems  Musculoskeletal:  Positive for back pain.  All other systems reviewed and are negative.       Objective    BP 120/72   Pulse 73   Temp 98 F (36.7 C) (Oral)   Resp 16   Wt 190 lb (86.2 kg)   SpO2 96%   BMI 25.95 kg/m   Physical Exam Vitals and nursing note reviewed.  Constitutional:      General: He is not in acute distress. Cardiovascular:     Rate and Rhythm: Normal rate and regular rhythm.  Pulmonary:     Effort: Pulmonary effort is normal.     Breath sounds: Normal breath sounds.  Abdominal:     Palpations: Abdomen is soft.     Tenderness: There is no abdominal tenderness.  Musculoskeletal:     Lumbar back: Spasms and tenderness present. No deformity. Decreased range of motion.  Neurological:     General: No focal deficit present.     Mental Status: He is alert and oriented to person, place, and time.         Assessment & Plan:  Midline low back pain with bilateral sciatica, unspecified chronicity -     DG Lumbar Spine Complete; Future     No follow-ups on file.   Tommie Raymond, MD
